# Patient Record
Sex: Male | Born: 1965
Health system: Southern US, Community
[De-identification: ages and names within clinical notes are randomized; demographics above are authoritative.]

## PROBLEM LIST (undated history)

## (undated) DIAGNOSIS — C6290 Malignant neoplasm of unspecified testis, unspecified whether descended or undescended: Secondary | ICD-10-CM

## (undated) DIAGNOSIS — Z86010 Personal history of colonic polyps: Secondary | ICD-10-CM

## (undated) DIAGNOSIS — K219 Gastro-esophageal reflux disease without esophagitis: Secondary | ICD-10-CM

## (undated) DIAGNOSIS — K5792 Diverticulitis of intestine, part unspecified, without perforation or abscess without bleeding: Secondary | ICD-10-CM

## (undated) DIAGNOSIS — T7840XA Allergy, unspecified, initial encounter: Secondary | ICD-10-CM

## (undated) DIAGNOSIS — C6931 Malignant neoplasm of right choroid: Secondary | ICD-10-CM

## (undated) DIAGNOSIS — E7800 Pure hypercholesterolemia, unspecified: Secondary | ICD-10-CM

## (undated) DIAGNOSIS — Z889 Allergy status to unspecified drugs, medicaments and biological substances status: Secondary | ICD-10-CM

## (undated) DIAGNOSIS — Z8719 Personal history of other diseases of the digestive system: Secondary | ICD-10-CM

## (undated) HISTORY — DX: Malignant neoplasm of right choroid: C69.31

## (undated) HISTORY — DX: Personal history of colonic polyps: Z86.010

## (undated) HISTORY — DX: Malignant neoplasm of unspecified testis, unspecified whether descended or undescended: C62.90

## (undated) HISTORY — PX: WISDOM TOOTH EXTRACTION: SHX21

## (undated) HISTORY — DX: Allergy, unspecified, initial encounter: T78.40XA

---

## 2004-01-12 ENCOUNTER — Ambulatory Visit (HOSPITAL_COMMUNITY): Admission: RE | Admit: 2004-01-12 | Discharge: 2004-01-12 | Payer: Self-pay | Admitting: Surgery

## 2004-01-12 ENCOUNTER — Ambulatory Visit (HOSPITAL_BASED_OUTPATIENT_CLINIC_OR_DEPARTMENT_OTHER): Admission: RE | Admit: 2004-01-12 | Discharge: 2004-01-12 | Payer: Self-pay | Admitting: Surgery

## 2012-04-23 ENCOUNTER — Ambulatory Visit: Payer: Self-pay | Admitting: Family Medicine

## 2012-04-23 ENCOUNTER — Encounter: Payer: Self-pay | Admitting: Family Medicine

## 2012-04-23 VITALS — BP 132/83 | HR 93 | Temp 98.1°F | Resp 16 | Ht 67.0 in | Wt 158.0 lb

## 2012-04-23 DIAGNOSIS — R319 Hematuria, unspecified: Secondary | ICD-10-CM

## 2012-04-23 DIAGNOSIS — Z0289 Encounter for other administrative examinations: Secondary | ICD-10-CM

## 2012-04-23 LAB — POCT URINALYSIS DIPSTICK
Glucose, UA: NEGATIVE
Protein, UA: NEGATIVE
Spec Grav, UA: 1.015

## 2012-04-27 DIAGNOSIS — Z0000 Encounter for general adult medical examination without abnormal findings: Secondary | ICD-10-CM | POA: Insufficient documentation

## 2012-04-27 NOTE — Progress Notes (Signed)
Subjective:    Patient ID: Ray Roberts, male    DOB: 1966/03/06, 47 y.o.   MRN: [id number]  HPI This 47 y.o. Cauc male is here for a DOT exam. He has not chronic medical problems  and maintains an active recreational life. He hiked >10 miles this past weekend w/ family and   friends; he reports moderately severe leg muscle soreness for a few days but is better now.  His primary care home is not clear; he has received care at a practice at [facility] and has  been seen at [facility]. He will schedule a CPE within the next 6 months.     Review of Systems  Constitutional: Negative.   HENT: Negative.   Eyes: Negative.   Respiratory: Negative.   Cardiovascular: Negative.   Gastrointestinal: Negative.   Genitourinary: Negative.   Musculoskeletal: Negative.   Skin: Negative.   Neurological: Negative.   Hematological: Negative.   Psychiatric/Behavioral: Negative.        Objective:   Physical Exam  Nursing note and vitals reviewed. Constitutional: He is oriented to person, place, and time. Vital signs are normal. He appears well-developed and well-nourished. No distress.  HENT:  Head: Normocephalic and atraumatic.  Right Ear: Hearing, tympanic membrane, external ear and ear canal normal.  Left Ear: Hearing, tympanic membrane, external ear and ear canal normal.  Nose: Nose normal. No mucosal edema, rhinorrhea, nasal deformity or septal deviation.  Mouth/Throat: Uvula is midline, oropharynx is clear and moist and mucous membranes are normal. No oral lesions. Normal dentition. No dental caries.  Eyes: Conjunctivae normal, EOM and lids are normal. Pupils are equal, round, and reactive to light. No scleral icterus.  Fundoscopic exam:      The right eye shows no arteriolar narrowing, no AV nicking, no hemorrhage and no papilledema. The right eye shows red reflex.      The left eye shows no arteriolar narrowing, no AV nicking, no hemorrhage and no papilledema. The left eye shows red  reflex. Neck: Normal range of motion. Neck supple. No thyromegaly present.  Cardiovascular: Normal rate, regular rhythm, normal heart sounds and intact distal pulses.  Exam reveals no gallop and no friction rub.   No murmur heard. Pulmonary/Chest: Effort normal and breath sounds normal. No respiratory distress. He exhibits no tenderness.  Abdominal: Soft. Normal appearance and normal aorta. He exhibits no distension, no abdominal bruit, no pulsatile midline mass and no mass. Bowel sounds are decreased. There is no hepatosplenomegaly. There is no tenderness. There is no guarding and no CVA tenderness. No hernia. Hernia confirmed negative in the right inguinal area and confirmed negative in the left inguinal area.  Genitourinary:       GU -except for hernia inspection- exam deferred.  Musculoskeletal: Normal range of motion. He exhibits no edema and no tenderness.  Lymphadenopathy:    He has no cervical adenopathy.  Neurological: He is alert and oriented to person, place, and time. He has normal reflexes. No cranial nerve deficit. He exhibits normal muscle tone. Coordination normal.  Skin: Skin is warm and dry. No rash noted. No erythema. No pallor.  Psychiatric: He has a normal mood and affect. His behavior is normal. Judgment and thought content normal.    Results for orders placed in visit on 04/23/12  POCT URINALYSIS DIPSTICK      Component Value Range   Color, UA yellow     Clarity, UA       Glucose, UA neg     Bilirubin,  UA       Ketones, UA       Spec Grav, UA 1.015     Blood, UA moderate     pH, UA       Protein, UA neg     Urobilinogen, UA       Nitrite, UA       Leukocytes, UA             Assessment & Plan:   1. Health examination of defined subpopulation - DOT physical POCT urinalysis dipstick Pt certified for 1 year.  2. Hematuria - painless microscopic Pt advised to have this addressed by PCP at CPE within next 3-6 months or sooner if he becomes symptomatic or has  gross hematuria.

## 2012-05-05 ENCOUNTER — Encounter: Payer: Self-pay | Admitting: Family Medicine

## 2014-04-01 DIAGNOSIS — C6931 Malignant neoplasm of right choroid: Secondary | ICD-10-CM

## 2014-04-01 HISTORY — PX: HIGH DOSE RADIATION IMPLANT INSERTION: SHX6258

## 2014-04-01 HISTORY — DX: Malignant neoplasm of right choroid: C69.31

## 2014-06-13 ENCOUNTER — Other Ambulatory Visit: Payer: Self-pay | Admitting: Internal Medicine

## 2014-06-13 DIAGNOSIS — C439 Malignant melanoma of skin, unspecified: Secondary | ICD-10-CM

## 2014-06-14 ENCOUNTER — Ambulatory Visit
Admission: RE | Admit: 2014-06-14 | Discharge: 2014-06-14 | Disposition: A | Discharge status: Home / Self Care | Payer: BLUE CROSS/BLUE SHIELD | Source: Ambulatory Visit | Attending: Internal Medicine | Admitting: Internal Medicine

## 2014-06-14 DIAGNOSIS — C439 Malignant melanoma of skin, unspecified: Secondary | ICD-10-CM

## 2014-06-14 MED ORDER — IOPAMIDOL (ISOVUE-300) INJECTION 61%
100.0000 mL | Freq: Once | INTRAVENOUS | Status: AC | PRN
  Administered 2014-06-14: 100 mL via INTRAVENOUS

## 2015-06-01 DIAGNOSIS — Z139 Encounter for screening, unspecified: Secondary | ICD-10-CM

## 2015-07-10 NOTE — Congregational Nurse Program (Signed)
Congregational Nurse Program Note  Date of Encounter: 06/01/2015  Past Medical History: No past medical history on file.  Encounter Details:     CNP Questionnaire - 07/10/15 1341    Patient Demographics   Is this a new or existing patient? New   Patient is considered a/an Not Applicable   Race Caucasian/White   Patient Assistance   Location of Patient Assistance Not Applicable   Patient's financial/insurance status Private Insurance Coverage   Uninsured Patient No   Patient referred to apply for the following financial assistance Not Applicable   Food insecurities addressed Not Applicable   Transportation assistance No   Assistance securing medications No   Educational health offerings Not Applicable   Encounter Details   Primary purpose of visit Other   Was an Emergency Department visit averted? Not Applicable   Does patient have a medical provider? Yes   Patient referred to Not Applicable   Was a mental health screening completed? (GAINS tool) No   Does patient have dental issues? No   Does patient have vision issues? No   Since previous encounter, have you referred patient for abnormal blood pressure that resulted in a new diagnosis or medication change? No   Since previous encounter, have you referred patient for abnormal blood glucose that resulted in a new diagnosis or medication change? No     06-01-15 Patient requested BP screening per his doctor request.  Patient verbalized he was glad that his BP today was normal.  Plan to follow up if needed or requested.  Hali Marry RN, CNP # [phone].

## 2016-06-10 DIAGNOSIS — Z0000 Encounter for general adult medical examination without abnormal findings: Secondary | ICD-10-CM | POA: Diagnosis not present

## 2016-06-17 DIAGNOSIS — R7301 Impaired fasting glucose: Secondary | ICD-10-CM | POA: Diagnosis not present

## 2016-06-17 DIAGNOSIS — C439 Malignant melanoma of skin, unspecified: Secondary | ICD-10-CM | POA: Diagnosis not present

## 2016-06-17 DIAGNOSIS — Z23 Encounter for immunization: Secondary | ICD-10-CM | POA: Diagnosis not present

## 2016-06-17 DIAGNOSIS — Z0000 Encounter for general adult medical examination without abnormal findings: Secondary | ICD-10-CM | POA: Diagnosis not present

## 2016-06-17 DIAGNOSIS — E784 Other hyperlipidemia: Secondary | ICD-10-CM | POA: Diagnosis not present

## 2016-06-17 DIAGNOSIS — Z1389 Encounter for screening for other disorder: Secondary | ICD-10-CM | POA: Diagnosis not present

## 2016-06-24 DIAGNOSIS — K4090 Unilateral inguinal hernia, without obstruction or gangrene, not specified as recurrent: Secondary | ICD-10-CM | POA: Diagnosis not present

## 2016-06-24 DIAGNOSIS — N448 Other noninflammatory disorders of the testis: Secondary | ICD-10-CM | POA: Diagnosis not present

## 2016-06-25 DIAGNOSIS — D4959 Neoplasm of unspecified behavior of other genitourinary organ: Secondary | ICD-10-CM | POA: Diagnosis not present

## 2016-07-03 ENCOUNTER — Ambulatory Visit: Payer: Self-pay | Admitting: General Surgery

## 2016-07-03 DIAGNOSIS — K429 Umbilical hernia without obstruction or gangrene: Secondary | ICD-10-CM | POA: Diagnosis not present

## 2016-07-03 DIAGNOSIS — K4090 Unilateral inguinal hernia, without obstruction or gangrene, not specified as recurrent: Secondary | ICD-10-CM | POA: Diagnosis not present

## 2016-07-03 NOTE — H&P (Signed)
Ray Roberts 07/03/2016 11:20 AM Location: [facility] Patient #: [id number] DOB: [birth year] Married / Language: English / Race: Refused to Report/Unreported Male  History of Present Illness Ray Hollingshead MD; 07/03/2016 12:14 PM) Patient words: np.  The patient is a 51 year old male.   Note:He is referred by Dr. Diona Roberts for consultation regarding a right inguinal hernia. He is known about the hernia for about 2 years. However, for about a year, he noted a enlargement of the right testicle. He saw his primary care physician Dr. Joylene Roberts who referred him to Dr. Diona Roberts. A right testicular mass was noted and was suspicious for malignancy based on ultrasound and clinical criteria. Radical orchiectomy was recommended. He is also noted to have a right inguinal hernia at that time. He states the hernia has been asymptomatic. He also reports an asymptomatic umbilical hernia.  Past Surgical History Ray Roberts, RMA; 07/03/2016 11:21 AM) No pertinent past surgical history  Allergies Shirlean Mylar Roberts, RMA; 07/03/2016 11:23 AM) Sulfacetamide *CHEMICALS*  Medication History Shirlean Mylar Roberts, RMA; 07/03/2016 11:23 AM) Atorvastatin Calcium (20MG  Tablet, Oral) Active. ZyrTEC Allergy (10MG  Tablet, Oral) Active. Medications Reconciled  Social History Ray Roberts, RMA; 07/03/2016 11:23 AM) Alcohol use Moderate alcohol use. Caffeine use Coffee. No drug use  Family History Ray Roberts, RMA; 07/03/2016 11:23 AM) Arthritis Mother. Cancer Father. Cerebrovascular Accident Daughter. Diabetes Mellitus Mother. Heart Disease Father. Heart disease in male family member before age 26 Hypertension Mother. Melanoma Father, Mother.  Other Problems Ray Roberts, RMA; 07/03/2016 11:23 AM) Cancer Diverticulosis Gastroesophageal Reflux Disease Hypercholesterolemia     Review of Systems (Ray Roberts RMA; 07/03/2016 11:23 AM) General Not Present- Appetite Loss, Chills, Fatigue,  Fever, Night Sweats, Weight Gain and Weight Loss. Skin Not Present- Change in Wart/Mole, Dryness, Hives, Jaundice, New Lesions, Non-Healing Wounds, Rash and Ulcer. HEENT Not Present- Earache, Hearing Loss, Hoarseness, Nose Bleed, Oral Ulcers, Ringing in the Ears, Seasonal Allergies, Sinus Pain, Sore Throat, Visual Disturbances, Wears glasses/contact lenses and Yellow Eyes. Respiratory Not Present- Bloody sputum, Chronic Cough, Difficulty Breathing, Snoring and Wheezing. Breast Not Present- Breast Mass, Breast Pain, Nipple Discharge and Skin Changes. Cardiovascular Not Present- Chest Pain, Difficulty Breathing Lying Down, Leg Cramps, Palpitations, Rapid Heart Rate, Shortness of Breath and Swelling of Extremities. Gastrointestinal Not Present- Abdominal Pain, Bloating, Bloody Stool, Change in Bowel Habits, Chronic diarrhea, Constipation, Difficulty Swallowing, Excessive gas, Gets full quickly at meals, Hemorrhoids, Indigestion, Nausea, Rectal Pain and Vomiting. Male Genitourinary Not Present- Blood in Urine, Change in Urinary Stream, Frequency, Impotence, Nocturia, Painful Urination, Urgency and Urine Leakage. Musculoskeletal Not Present- Back Pain, Joint Pain, Joint Stiffness, Muscle Pain, Muscle Weakness and Swelling of Extremities. Neurological Not Present- Decreased Memory, Fainting, Headaches, Numbness, Seizures, Tingling, Tremor, Trouble walking and Weakness. Psychiatric Not Present- Anxiety, Bipolar, Change in Sleep Pattern, Depression, Fearful and Frequent crying. Endocrine Not Present- Cold Intolerance, Excessive Hunger, Hair Changes, Heat Intolerance, Hot flashes and New Diabetes. Hematology Not Present- Blood Thinners, Easy Bruising, Excessive bleeding, Gland problems, HIV and Persistent Infections.  Vitals (Ray Roberts RMA; 07/03/2016 11:24 AM) 07/03/2016 11:23 AM Weight: 164 lb Height: 67in Body Surface Area: 1.86 m Body Mass Index: 25.69 kg/m  Pulse: 91 (Regular)  BP: 142/90  (Sitting, Left Arm, Standard)      Physical Exam Ray Hollingshead MD; 07/03/2016 12:16 PM)  The physical exam findings are as follows: Note:GENERAL APPEARANCE: WDWN in NAD. Pleasant and cooperative.  EARS, NOSE, MOUTH THROAT: Nenzel/AT external ears: no lesions or deformities external nose: no lesions  or deformities hearing: grossly normal lips: moist, no deformities EYES external: conjunctiva, lids, sclerae normal pupils: equal, round glasses: no  NECK: Supple, no obvious mass or thyroid mass/enlargement, no trachea deviation  CV ascultation: RRR, no murmur extremity edema: no   RESP auscultation: breath sounds equal and clear respiratory effort: normal   GASTROINTESTINAL abdomen: Soft, non-tender, non-distended, no masses liver and spleen: not enlarged. hernia: Right inguinal bulge consistent with hernia; no left inguinal bulge; reducible umbilical hernia scar: none present   GENITOURINARY scrotum: Hard right testicular mass; enlarged lymph node/soft tissue area right groin penis: no lesions  MUSCULOSKELETAL station and gait: normal digits/nails: no clubbing or cyanosis muscle strength: grossly normal all extremities deformities: none instability: none  NEUROLOGIC sensation: intact to touch   PSYCHIATRIC alertness and orientation: normal mood/affect/behavior: normal judgement and insight: normal    Assessment & Plan Ray Hollingshead MD; 07/03/2016 12:17 PM)  INGUINAL HERNIA OF RIGHT SIDE WITHOUT OBSTRUCTION OR GANGRENE (K40.90) Impression: This has not been symptomatic. However, he is going to have a radical right orchiectomy and the hernia should be repaired same time in my opinion. He also has what appears to be an enlarged right inguinal lymph node that may need to be removed and we discussed this..  Plan: Open right inguinal hernia with mesh in coordination with radical right orchiectomy. I have explained the procedure, risks, and aftercare of  inguinal hernia repair. Risks include but are not limited to bleeding, infection, wound problems, anesthesia, recurrence, bladder or intestine injury, urinary retention, testicular dysfunction, chronic pain, mesh problems. He seems to understand and agrees with the plan. My office will coordinate this with Dr. Alan Ripper office.  Jackolyn Confer, M.D.

## 2016-07-04 ENCOUNTER — Other Ambulatory Visit: Payer: Self-pay | Admitting: Urology

## 2016-07-08 ENCOUNTER — Encounter (HOSPITAL_COMMUNITY): Payer: Self-pay

## 2016-07-08 NOTE — Patient Instructions (Signed)
Ray Roberts  07/08/2016   Your procedure is scheduled on: 07-11-16  Report to [facility] Main  Entrance Take Cane Beds  elevators to 3rd floor to  [place] [facility] [place].   Call this number if you have problems the morning of surgery [phone]    Remember: ONLY 1 PERSON MAY GO WITH YOU TO SHORT STAY TO GET  READY MORNING OF YOUR SURGERY.  Do not eat food or drink liquids :After Midnight.     Take these medicines the morning of surgery with A SIP OF WATER:  Famotidine(pepcid), nasal spray as needed                                You may not have any metal on your body including hair pins and              piercings  Do not wear jewelry, make-up, lotions, powders or perfumes, deodorant              Men may shave face and neck.   Do not bring valuables to the hospital. [place].  Contacts, dentures or bridgework may not be worn into surgery.      Patients discharged the day of surgery will not be allowed to drive home.  Name and phone number of your driver:  Special Instructions: N/A              Please read over the following fact sheets you were given: _____________________________________________________________________             [facility] - Preparing for Surgery Before surgery, you can play an important role.  Because skin is not sterile, your skin needs to be as free of germs as possible.  You can reduce the number of germs on your skin by washing with CHG (chlorahexidine gluconate) soap before surgery.  CHG is an antiseptic cleaner which kills germs and bonds with the skin to continue killing germs even after washing. Please DO NOT use if you have an allergy to CHG or antibacterial soaps.  If your skin becomes reddened/irritated stop using the CHG and inform your nurse when you arrive at Short Stay. Do not shave (including legs and underarms) for at least 48 hours prior to the first CHG shower.   You may shave your face/neck. Please follow these instructions carefully:  1.  Shower with CHG Soap the night before surgery and the  morning of Surgery.  2.  If you choose to wash your hair, wash your hair first as usual with your  normal  shampoo.  3.  After you shampoo, rinse your hair and body thoroughly to remove the  shampoo.                           4.  Use CHG as you would any other liquid soap.  You can apply chg directly  to the skin and wash                       Gently with a scrungie or clean washcloth.  5.  Apply the CHG Soap to your body ONLY FROM THE NECK DOWN.  Do not use on face/ open                           Wound or open sores. Avoid contact with eyes, ears mouth and genitals (private parts).                       Wash face,  Genitals (private parts) with your normal soap.             6.  Wash thoroughly, paying special attention to the area where your surgery  will be performed.  7.  Thoroughly rinse your body with warm water from the neck down.  8.  DO NOT shower/wash with your normal soap after using and rinsing off  the CHG Soap.                9.  Pat yourself dry with a clean towel.            10.  Wear clean pajamas.            11.  Place clean sheets on your bed the night of your first shower and do not  sleep with pets. Day of Surgery : Do not apply any lotions/deodorants the morning of surgery.  Please wear clean clothes to the hospital/surgery center.  FAILURE TO FOLLOW THESE INSTRUCTIONS MAY RESULT IN THE CANCELLATION OF YOUR SURGERY PATIENT SIGNATURE_________________________________  NURSE SIGNATURE__________________________________  ________________________________________________________________________

## 2016-07-09 ENCOUNTER — Encounter (HOSPITAL_COMMUNITY): Payer: Self-pay

## 2016-07-09 ENCOUNTER — Encounter (HOSPITAL_COMMUNITY)
Admission: RE | Admit: 2016-07-09 | Discharge: 2016-07-09 | Disposition: A | Discharge status: Home / Self Care | Payer: 59 | Source: Ambulatory Visit | Attending: General Surgery | Admitting: General Surgery

## 2016-07-09 DIAGNOSIS — J302 Other seasonal allergic rhinitis: Secondary | ICD-10-CM | POA: Diagnosis not present

## 2016-07-09 DIAGNOSIS — K4090 Unilateral inguinal hernia, without obstruction or gangrene, not specified as recurrent: Secondary | ICD-10-CM | POA: Diagnosis not present

## 2016-07-09 DIAGNOSIS — Z79899 Other long term (current) drug therapy: Secondary | ICD-10-CM | POA: Diagnosis not present

## 2016-07-09 DIAGNOSIS — E7800 Pure hypercholesterolemia, unspecified: Secondary | ICD-10-CM | POA: Diagnosis not present

## 2016-07-09 DIAGNOSIS — E785 Hyperlipidemia, unspecified: Secondary | ICD-10-CM | POA: Diagnosis not present

## 2016-07-09 DIAGNOSIS — K219 Gastro-esophageal reflux disease without esophagitis: Secondary | ICD-10-CM | POA: Diagnosis not present

## 2016-07-09 DIAGNOSIS — Z882 Allergy status to sulfonamides status: Secondary | ICD-10-CM | POA: Diagnosis not present

## 2016-07-09 DIAGNOSIS — C6211 Malignant neoplasm of descended right testis: Secondary | ICD-10-CM | POA: Diagnosis not present

## 2016-07-09 DIAGNOSIS — N5089 Other specified disorders of the male genital organs: Secondary | ICD-10-CM | POA: Diagnosis present

## 2016-07-09 HISTORY — DX: Pure hypercholesterolemia, unspecified: E78.00

## 2016-07-09 HISTORY — DX: Gastro-esophageal reflux disease without esophagitis: K21.9

## 2016-07-09 HISTORY — DX: Allergy status to unspecified drugs, medicaments and biological substances: Z88.9

## 2016-07-09 HISTORY — DX: Personal history of other diseases of the digestive system: Z87.19

## 2016-07-09 LAB — CBC
HCT: 42.4 % (ref 39.0–52.0)
HEMOGLOBIN: 14.4 g/dL (ref 13.0–17.0)
MCH: 29.8 pg (ref 26.0–34.0)
MCHC: 34 g/dL (ref 30.0–36.0)
MCV: 87.6 fL (ref 78.0–100.0)
PLATELETS: 395 10*3/uL (ref 150–400)
RBC: 4.84 MIL/uL (ref 4.22–5.81)
RDW: 13.3 % (ref 11.5–15.5)
WBC: 7.7 10*3/uL (ref 4.0–10.5)

## 2016-07-09 LAB — BASIC METABOLIC PANEL
Anion gap: 6 (ref 5–15)
BUN: 12 mg/dL (ref 6–20)
CALCIUM: 9.3 mg/dL (ref 8.9–10.3)
CHLORIDE: 103 mmol/L (ref 101–111)
CO2: 27 mmol/L (ref 22–32)
CREATININE: 0.9 mg/dL (ref 0.61–1.24)
Glucose, Bld: 103 mg/dL — ABNORMAL HIGH (ref 65–99)
Potassium: 4.5 mmol/L (ref 3.5–5.1)
SODIUM: 136 mmol/L (ref 135–145)

## 2016-07-10 NOTE — H&P (Signed)
Urology History and Physical Exam  CC: Testicular mass  HPI: 51 year old male presents for combined rt inguinal orchiectomy as well as a right inguinal hermia repair. He recently presented with a right testicular mass. Evaluation included a scrotal U/S revealing findings c/w a right testicular tumor. pE corresponded to U/S. His serum tumor markers were nml. He also has a right inguinal hernia and saw Dr Jackolyn Confer. This will be repaired in conjunction with his right inguinal orchiectomy.  PMH: Past Medical History:  Diagnosis Date  . Elevated cholesterol   . GERD (gastroesophageal reflux disease)   . History of hiatal hernia   . Hx of seasonal allergies   . Testicular mass    right    PSH: Past Surgical History:  Procedure Laterality Date  . radiation seed pack  2016   for mole on back of right eye    Allergies: Allergies  Allergen Reactions  . Sulfa Antibiotics Hives    Medications: No prescriptions prior to admission.     Social History: Social History   Social History  . Marital status: Married    Spouse name: N/A  . Number of children: N/A  . Years of education: N/A   Occupational History  . Not on file.   Social History Main Topics  . Smoking status: Never Smoker  . Smokeless tobacco: Never Used  . Alcohol use Yes     Comment:  15 cans beer/week  . Drug use: No  . Sexual activity: Not on file   Other Topics Concern  . Not on file   Social History Narrative  . No narrative on file    Family History: No family history on file.  Review of Systems: Positive: Rt testicular swelling Negative:   A further 10 point review of systems was negative except what is listed in the HPI.                  Physical Exam: @VITALS2 @ General: No acute distress.  Awake. Head:  Normocephalic.  Atraumatic. ENT:  EOMI.  Mucous membranes moist Neck:  Supple.  No lymphadenopathy. CV:  S1 present. S2 present. Regular rate. Pulmonary: Equal effort bilaterally.   Clear to auscultation bilaterally. Abdomen: Soft.  Non tender to palpation. Skin:  Normal turgor.  No visible rash. Extremity: No gross deformity of bilateral upper extremities.  No gross deformity of                             lower extremities. Neurologic: Alert. Appropriate mood.  Penis:  Circumcised.  No lesions. Urethra: Orthotopic meatus. Scrotum: No lesions.  No ecchymosis.  No erythema. Testicles: Descended bilaterally.  Rt testicular mass. Epididymis: Palpable bilaterally. Nontender to palpation.  Studies:  Recent Labs     07/09/16  0850  HGB  14.4  WBC  7.7  PLT  395    Recent Labs     07/09/16  0850  NA  136  K  4.5  CL  103  CO2  27  BUN  12  CREATININE  0.90  CALCIUM  9.3  GFRNONAA  >60  GFRAA  >60     No results for input(s): INR, APTT in the last 72 hours.  Invalid input(s): PT   Invalid input(s): ABG    Assessment:  Right testicular mass, right inguinal hernia  Plan: Right inguinal orchiectomy

## 2016-07-11 ENCOUNTER — Ambulatory Visit (HOSPITAL_COMMUNITY): Payer: 59 | Admitting: Anesthesiology

## 2016-07-11 ENCOUNTER — Encounter (HOSPITAL_COMMUNITY): Payer: Self-pay | Admitting: *Deleted

## 2016-07-11 ENCOUNTER — Encounter (HOSPITAL_COMMUNITY)
Admission: RE | Disposition: A | Discharge status: Home / Self Care | Payer: Self-pay | Source: Ambulatory Visit | Attending: General Surgery

## 2016-07-11 ENCOUNTER — Ambulatory Visit (HOSPITAL_COMMUNITY)
Admission: RE | Admit: 2016-07-11 | Discharge: 2016-07-11 | Disposition: A | Discharge status: Home / Self Care | Payer: 59 | Source: Ambulatory Visit | Attending: General Surgery | Admitting: General Surgery

## 2016-07-11 DIAGNOSIS — C6211 Malignant neoplasm of descended right testis: Secondary | ICD-10-CM | POA: Insufficient documentation

## 2016-07-11 DIAGNOSIS — E7800 Pure hypercholesterolemia, unspecified: Secondary | ICD-10-CM | POA: Insufficient documentation

## 2016-07-11 DIAGNOSIS — K4090 Unilateral inguinal hernia, without obstruction or gangrene, not specified as recurrent: Secondary | ICD-10-CM | POA: Diagnosis not present

## 2016-07-11 DIAGNOSIS — Z882 Allergy status to sulfonamides status: Secondary | ICD-10-CM | POA: Insufficient documentation

## 2016-07-11 DIAGNOSIS — K219 Gastro-esophageal reflux disease without esophagitis: Secondary | ICD-10-CM | POA: Insufficient documentation

## 2016-07-11 DIAGNOSIS — Z79899 Other long term (current) drug therapy: Secondary | ICD-10-CM | POA: Insufficient documentation

## 2016-07-11 DIAGNOSIS — G8918 Other acute postprocedural pain: Secondary | ICD-10-CM | POA: Diagnosis not present

## 2016-07-11 DIAGNOSIS — C6291 Malignant neoplasm of right testis, unspecified whether descended or undescended: Secondary | ICD-10-CM | POA: Diagnosis not present

## 2016-07-11 DIAGNOSIS — J302 Other seasonal allergic rhinitis: Secondary | ICD-10-CM | POA: Insufficient documentation

## 2016-07-11 DIAGNOSIS — E785 Hyperlipidemia, unspecified: Secondary | ICD-10-CM | POA: Insufficient documentation

## 2016-07-11 HISTORY — PX: ORCHIECTOMY: SHX2116

## 2016-07-11 HISTORY — PX: INSERTION OF MESH: SHX5868

## 2016-07-11 HISTORY — PX: INGUINAL HERNIA REPAIR: SHX194

## 2016-07-11 SURGERY — REPAIR, HERNIA, INGUINAL, ADULT
Anesthesia: Regional | Site: Scrotum | Laterality: Right

## 2016-07-11 MED ORDER — LACTATED RINGERS IV SOLN
INTRAVENOUS | Status: DC
  Administered 2016-07-11: 08:00:00 via INTRAVENOUS

## 2016-07-11 MED ORDER — ONDANSETRON HCL 4 MG/2ML IJ SOLN
INTRAMUSCULAR | Status: AC
  Filled 2016-07-11: qty 2

## 2016-07-11 MED ORDER — 0.9 % SODIUM CHLORIDE (POUR BTL) OPTIME
TOPICAL | Status: DC | PRN
  Administered 2016-07-11: 1000 mL

## 2016-07-11 MED ORDER — POLYMYXIN B SULFATE 500000 UNITS IJ SOLR
INTRAMUSCULAR | Status: AC
  Filled 2016-07-11: qty 500000

## 2016-07-11 MED ORDER — SUGAMMADEX SODIUM 200 MG/2ML IV SOLN
INTRAVENOUS | Status: DC | PRN
Start: 2016-07-11 — End: 2016-07-11
  Administered 2016-07-11: 150 mg via INTRAVENOUS

## 2016-07-11 MED ORDER — DEXAMETHASONE SODIUM PHOSPHATE 10 MG/ML IJ SOLN
INTRAMUSCULAR | Status: DC | PRN
  Administered 2016-07-11: 10 mg via INTRAVENOUS

## 2016-07-11 MED ORDER — HYDROCODONE-ACETAMINOPHEN 7.5-325 MG PO TABS
ORAL_TABLET | ORAL | Status: AC
  Filled 2016-07-11: qty 1

## 2016-07-11 MED ORDER — HYDROCODONE-ACETAMINOPHEN 7.5-325 MG PO TABS
1.0000 | ORAL_TABLET | Freq: Once | ORAL | Status: AC | PRN
  Administered 2016-07-11: 1 via ORAL

## 2016-07-11 MED ORDER — BUPIVACAINE-EPINEPHRINE (PF) 0.5% -1:200000 IJ SOLN
INTRAMUSCULAR | Status: AC
  Filled 2016-07-11: qty 30

## 2016-07-11 MED ORDER — OXYCODONE HCL 5 MG PO TABS: 5.0000 mg | ORAL_TABLET | ORAL | 0 refills | Status: DC | PRN

## 2016-07-11 MED ORDER — SODIUM CHLORIDE 0.9 % IR SOLN
Status: DC | PRN
  Administered 2016-07-11: 500 mL

## 2016-07-11 MED ORDER — BUPIVACAINE-EPINEPHRINE (PF) 0.5% -1:200000 IJ SOLN
INTRAMUSCULAR | Status: DC | PRN
  Administered 2016-07-11: 30 mL via PERINEURAL

## 2016-07-11 MED ORDER — CHLORHEXIDINE GLUCONATE CLOTH 2 % EX PADS: 6.0000 | MEDICATED_PAD | Freq: Once | CUTANEOUS | Status: DC

## 2016-07-11 MED ORDER — PROMETHAZINE HCL 25 MG/ML IJ SOLN: 6.2500 mg | INTRAMUSCULAR | Status: DC | PRN

## 2016-07-11 MED ORDER — FENTANYL CITRATE (PF) 100 MCG/2ML IJ SOLN
50.0000 ug | INTRAMUSCULAR | Status: AC | PRN
  Administered 2016-07-11: 50 ug via INTRAVENOUS
  Administered 2016-07-11 (×2): 100 ug via INTRAVENOUS

## 2016-07-11 MED ORDER — FENTANYL CITRATE (PF) 250 MCG/5ML IJ SOLN
INTRAMUSCULAR | Status: AC
  Filled 2016-07-11: qty 5

## 2016-07-11 MED ORDER — MIDAZOLAM HCL 2 MG/2ML IJ SOLN
1.0000 mg | INTRAMUSCULAR | Status: DC | PRN
  Administered 2016-07-11: 2 mg via INTRAVENOUS

## 2016-07-11 MED ORDER — MEPERIDINE HCL 50 MG/ML IJ SOLN: 6.2500 mg | INTRAMUSCULAR | Status: DC | PRN

## 2016-07-11 MED ORDER — MIDAZOLAM HCL 2 MG/2ML IJ SOLN
INTRAMUSCULAR | Status: AC
  Filled 2016-07-11: qty 2

## 2016-07-11 MED ORDER — PROPOFOL 10 MG/ML IV BOLUS
INTRAVENOUS | Status: AC
  Filled 2016-07-11: qty 20

## 2016-07-11 MED ORDER — CEFAZOLIN SODIUM-DEXTROSE 2-4 GM/100ML-% IV SOLN
2.0000 g | INTRAVENOUS | Status: AC
  Administered 2016-07-11: 2 g via INTRAVENOUS
  Filled 2016-07-11: qty 100

## 2016-07-11 MED ORDER — ROCURONIUM BROMIDE 100 MG/10ML IV SOLN
INTRAVENOUS | Status: DC | PRN
  Administered 2016-07-11: 50 mg via INTRAVENOUS

## 2016-07-11 MED ORDER — ROCURONIUM BROMIDE 50 MG/5ML IV SOSY
PREFILLED_SYRINGE | INTRAVENOUS | Status: AC
  Filled 2016-07-11: qty 5

## 2016-07-11 MED ORDER — DEXAMETHASONE SODIUM PHOSPHATE 10 MG/ML IJ SOLN
INTRAMUSCULAR | Status: AC
  Filled 2016-07-11: qty 1

## 2016-07-11 MED ORDER — LIDOCAINE HCL (CARDIAC) 20 MG/ML IV SOLN
INTRAVENOUS | Status: DC | PRN
  Administered 2016-07-11: 50 mg via INTRAVENOUS

## 2016-07-11 MED ORDER — HYDROMORPHONE HCL 2 MG/ML IJ SOLN
0.3000 mg | INTRAMUSCULAR | Status: DC | PRN
  Administered 2016-07-11: 0.3 mg via INTRAVENOUS

## 2016-07-11 MED ORDER — BUPIVACAINE-EPINEPHRINE 0.5% -1:200000 IJ SOLN
INTRAMUSCULAR | Status: DC | PRN
  Administered 2016-07-11: 13 mL

## 2016-07-11 MED ORDER — LIDOCAINE 2% (20 MG/ML) 5 ML SYRINGE
INTRAMUSCULAR | Status: AC
  Filled 2016-07-11: qty 5

## 2016-07-11 MED ORDER — SCOPOLAMINE 1 MG/3DAYS TD PT72: 1.0000 | MEDICATED_PATCH | TRANSDERMAL | Status: DC

## 2016-07-11 MED ORDER — PROPOFOL 10 MG/ML IV BOLUS
INTRAVENOUS | Status: DC | PRN
  Administered 2016-07-11: 150 mg via INTRAVENOUS
  Administered 2016-07-11: 50 mg via INTRAVENOUS

## 2016-07-11 MED ORDER — HYDROMORPHONE HCL 2 MG/ML IJ SOLN
INTRAMUSCULAR | Status: AC
  Filled 2016-07-11: qty 1

## 2016-07-11 MED ORDER — ONDANSETRON HCL 4 MG/2ML IJ SOLN
INTRAMUSCULAR | Status: DC | PRN
  Administered 2016-07-11: 4 mg via INTRAVENOUS

## 2016-07-11 MED ORDER — SUGAMMADEX SODIUM 200 MG/2ML IV SOLN
INTRAVENOUS | Status: AC
  Filled 2016-07-11: qty 2

## 2016-07-11 MED ORDER — CEFAZOLIN SODIUM-DEXTROSE 2-4 GM/100ML-% IV SOLN: 2.0000 g | INTRAVENOUS | Status: DC

## 2016-07-11 MED FILL — oxyCODONE HCL 5 MG TABS: 5 | 7 days supply | Qty: 40 | Fill #0

## 2016-07-11 SURGICAL SUPPLY — 51 items
APL SKNCLS STERI-STRIP NONHPOA (GAUZE/BANDAGES/DRESSINGS) ×2
BENZOIN TINCTURE PRP APPL 2/3 (GAUZE/BANDAGES/DRESSINGS) ×3 IMPLANT
BLADE HEX COATED 2.75 (ELECTRODE) ×6 IMPLANT
BLADE SURG 15 STRL LF DISP TIS (BLADE) ×2 IMPLANT
BLADE SURG 15 STRL SS (BLADE) ×3
CHLORAPREP W/TINT 26ML (MISCELLANEOUS) ×3 IMPLANT
COVER SURGICAL LIGHT HANDLE (MISCELLANEOUS) ×3 IMPLANT
DECANTER SPIKE VIAL GLASS SM (MISCELLANEOUS) ×3 IMPLANT
DRAIN PENROSE 18X1/2 LTX STRL (DRAIN) ×1 IMPLANT
DRAIN PENROSE 18X1/4 LTX STRL (WOUND CARE) ×3 IMPLANT
DRAPE INCISE IOBAN 66X45 STRL (DRAPES) ×3 IMPLANT
DRAPE LAPAROTOMY T 98X78 PEDS (DRAPES) ×3 IMPLANT
DRAPE LAPAROTOMY TRNSV 102X78 (DRAPE) ×3 IMPLANT
DRAPE UTILITY XL STRL (DRAPES) ×3 IMPLANT
DRSG KUZMA FLUFF (GAUZE/BANDAGES/DRESSINGS) ×1 IMPLANT
DRSG TEGADERM 4X4.75 (GAUZE/BANDAGES/DRESSINGS) ×1 IMPLANT
ELECT PENCIL ROCKER SW 15FT (MISCELLANEOUS) ×3 IMPLANT
ELECT REM PT RETURN 15FT ADLT (MISCELLANEOUS) ×6 IMPLANT
GAUZE SPONGE 4X4 12PLY STRL (GAUZE/BANDAGES/DRESSINGS) ×1 IMPLANT
GLOVE BIO SURGEON STRL SZ 6.5 (GLOVE) ×1 IMPLANT
GLOVE BIOGEL M 8.0 STRL (GLOVE) ×3 IMPLANT
GLOVE BIOGEL PI IND STRL 6.5 (GLOVE) IMPLANT
GLOVE BIOGEL PI IND STRL 7.5 (GLOVE) IMPLANT
GLOVE BIOGEL PI INDICATOR 6.5 (GLOVE) ×1
GLOVE BIOGEL PI INDICATOR 7.5 (GLOVE) ×2
GLOVE ECLIPSE 8.0 STRL XLNG CF (GLOVE) ×4 IMPLANT
GLOVE INDICATOR 8.0 STRL GRN (GLOVE) ×6 IMPLANT
GOWN STRL REUS W/TWL LRG LVL3 (GOWN DISPOSABLE) ×3 IMPLANT
GOWN STRL REUS W/TWL XL LVL3 (GOWN DISPOSABLE) ×8 IMPLANT
KIT BASIN OR (CUSTOM PROCEDURE TRAY) ×6 IMPLANT
MESH HERNIA 3X6 (Mesh General) ×1 IMPLANT
NDL HYPO 25X1 1.5 SAFETY (NEEDLE) ×2 IMPLANT
NEEDLE HYPO 25X1 1.5 SAFETY (NEEDLE) ×3 IMPLANT
PACK BASIC VI WITH GOWN DISP (CUSTOM PROCEDURE TRAY) ×3 IMPLANT
PACK GENERAL/GYN (CUSTOM PROCEDURE TRAY) ×3 IMPLANT
SPONGE LAP 4X18 X RAY DECT (DISPOSABLE) ×3 IMPLANT
STRIP CLOSURE SKIN 1/2X4 (GAUZE/BANDAGES/DRESSINGS) ×3 IMPLANT
SUPPORT SCROTAL MED ADLT STRP (MISCELLANEOUS) ×1 IMPLANT
SUT CHROMIC 3 0 SH 27 (SUTURE) ×3 IMPLANT
SUT MNCRL AB 4-0 PS2 18 (SUTURE) ×6 IMPLANT
SUT PROLENE 2 0 CT2 30 (SUTURE) ×6 IMPLANT
SUT VIC AB 2-0 SH 18 (SUTURE) ×6 IMPLANT
SUT VIC AB 2-0 UR5 27 (SUTURE) IMPLANT
SUT VIC AB 3-0 SH 27 (SUTURE) ×9
SUT VIC AB 3-0 SH 27XBRD (SUTURE) ×4 IMPLANT
SUT VICRYL 0 TIES 12 18 (SUTURE) ×3 IMPLANT
SYR BULB IRRIGATION 50ML (SYRINGE) ×3 IMPLANT
SYR CONTROL 10ML LL (SYRINGE) ×3 IMPLANT
TOWEL OR 17X26 10 PK STRL BLUE (TOWEL DISPOSABLE) ×6 IMPLANT
TOWEL OR NON WOVEN STRL DISP B (DISPOSABLE) ×3 IMPLANT
YANKAUER SUCT BULB TIP 10FT TU (MISCELLANEOUS) ×3 IMPLANT

## 2016-07-11 NOTE — Op Note (Signed)
Preoperative diagnosis: Right testicular mass, probable testicular cancer.  Postoperative diagnosis: Same  Principal procedure: Right inguinal orchiectomy  Surgeon: Diona Fanti  First assistant: Zella Richer  Anesthesia: Gen. with LMA  Complications: None  Estimated blood loss: Less than 5 mL  Specimen: Right testicle and cord  Indications: 51 year old male with recent presentation with a right testicular mass.  This has been present for some time.  He was having no significant pain.  The patient underwent ultrasound which revealed intratesticular findings consistent with testicular cancer.  Serum markers including LDH, alpha-fetoprotein and beta hCG were all negative.  The patient has an ipsilateral right inguinal hernia with symptoms.  He has been referred to Dr. Zella Richer, who has recommended inguinal hernia repair concurrently with his inguinal orchiectomy.  The patient is aware of the risks and complications of both of these procedures.  He desires to proceed.  Description of procedure: The patient was properly identified and marked in the holding area.  He received preoperative IV antibiotics.  He was taken the operating room where general anesthesia was administered with the LMA.  He was placed in the recumbent position.  Genitalia, perineum and lower abdomen were prepped and draped.  Proper timeout was performed.  A right inguinal incision was made in an oblique fashion.  This was carried down into the subcutaneous tissue and down to the external oblique fascia with electrocautery.  The fascia was skeletonized.  The external inguinal ring was identified.  The external fascia was then incised along the course of the incision.  The spermatic cord was easily identified inferiorly.  This was circumferentially dissected.  A Penrose drain was placed for retraction.  Dissection was then carried inferiorly along the spermatic cord.  The right testicle was delivered into the wound by pressure on  the scrotum.  The gubernaculum was divided with electrocautery.  Small bleeders were carefully electrocoagulated.  Dissection was then carried up to the proximal spermatic cord which was skeletonized.  Careful attention was paid to avoid injury to the ilioinguinal or genitofemoral nerve.  The spermatic cord was clamped as high as possible, and then divided.  Inferior to the [facility] clamp.  The spermatic cord was doubly ligated with 0 silk ties.  At this point, the inguinal orchiectomy was completed.  There was some worry about a femoral/inguinal lymph node which was enlarged.  Careful dissection down to this area revealed no evident adenopathy.  Dr. Zella Richer then commenced with inguinal hernia repair.  That will be dictated in a separate note.  The patient tolerated his inguinal orchiectomy without problems.

## 2016-07-11 NOTE — H&P (View-Only) (Signed)
Ray Roberts 07/03/2016 11:20 AM Location: [facility] Patient #: [id number] DOB: [birth year] Married / Language: English / Race: Refused to Report/Unreported Male  History of Present Illness [name] MD; 07/03/2016 12:14 PM) Patient words: np.  The patient is a 51 year old male.   Note:He is referred by Dr. Diona Fanti for consultation regarding a right inguinal hernia. He is known about the hernia for about 2 years. However, for about a year, he noted a enlargement of the right testicle. He saw his primary care physician Dr. Joylene Draft who referred him to Dr. Diona Fanti. A right testicular mass was noted and was suspicious for malignancy based on ultrasound and clinical criteria. Radical orchiectomy was recommended. He is also noted to have a right inguinal hernia at that time. He states the hernia has been asymptomatic. He also reports an asymptomatic umbilical hernia.  Past Surgical History Yehuda Mao, RMA; 07/03/2016 11:21 AM) No pertinent past surgical history  Allergies Shirlean Mylar Gwynn, RMA; 07/03/2016 11:23 AM) Sulfacetamide *CHEMICALS*  Medication History Shirlean Mylar Gwynn, RMA; 07/03/2016 11:23 AM) Atorvastatin Calcium (20MG  Tablet, Oral) Active. ZyrTEC Allergy (10MG  Tablet, Oral) Active. Medications Reconciled  Social History Yehuda Mao, RMA; 07/03/2016 11:23 AM) Alcohol use Moderate alcohol use. Caffeine use Coffee. No drug use  Family History Yehuda Mao, RMA; 07/03/2016 11:23 AM) Arthritis Mother. Cancer Father. Cerebrovascular Accident Daughter. Diabetes Mellitus Mother. Heart Disease Father. Heart disease in male family member before age 31 Hypertension Mother. Melanoma Father, Mother.  Other Problems Yehuda Mao, RMA; 07/03/2016 11:23 AM) Cancer Diverticulosis Gastroesophageal Reflux Disease Hypercholesterolemia     Review of Systems (Robin Gwynn RMA; 07/03/2016 11:23 AM) General Not Present- Appetite Loss, Chills, Fatigue,  Fever, Night Sweats, Weight Gain and Weight Loss. Skin Not Present- Change in Wart/Mole, Dryness, Hives, Jaundice, New Lesions, Non-Healing Wounds, Rash and Ulcer. HEENT Not Present- Earache, Hearing Loss, Hoarseness, Nose Bleed, Oral Ulcers, Ringing in the Ears, Seasonal Allergies, Sinus Pain, Sore Throat, Visual Disturbances, Wears glasses/contact lenses and Yellow Eyes. Respiratory Not Present- Bloody sputum, Chronic Cough, Difficulty Breathing, Snoring and Wheezing. Breast Not Present- Breast Mass, Breast Pain, Nipple Discharge and Skin Changes. Cardiovascular Not Present- Chest Pain, Difficulty Breathing Lying Down, Leg Cramps, Palpitations, Rapid Heart Rate, Shortness of Breath and Swelling of Extremities. Gastrointestinal Not Present- Abdominal Pain, Bloating, Bloody Stool, Change in Bowel Habits, Chronic diarrhea, Constipation, Difficulty Swallowing, Excessive gas, Gets full quickly at meals, Hemorrhoids, Indigestion, Nausea, Rectal Pain and Vomiting. Male Genitourinary Not Present- Blood in Urine, Change in Urinary Stream, Frequency, Impotence, Nocturia, Painful Urination, Urgency and Urine Leakage. Musculoskeletal Not Present- Back Pain, Joint Pain, Joint Stiffness, Muscle Pain, Muscle Weakness and Swelling of Extremities. Neurological Not Present- Decreased Memory, Fainting, Headaches, Numbness, Seizures, Tingling, Tremor, Trouble walking and Weakness. Psychiatric Not Present- Anxiety, Bipolar, Change in Sleep Pattern, Depression, Fearful and Frequent crying. Endocrine Not Present- Cold Intolerance, Excessive Hunger, Hair Changes, Heat Intolerance, Hot flashes and New Diabetes. Hematology Not Present- Blood Thinners, Easy Bruising, Excessive bleeding, Gland problems, HIV and Persistent Infections.  Vitals (Robin Gwynn RMA; 07/03/2016 11:24 AM) 07/03/2016 11:23 AM Weight: 164 lb Height: 67in Body Surface Area: 1.86 m Body Mass Index: 25.69 kg/m  Pulse: 91 (Regular)  BP: 142/90  (Sitting, Left Arm, Standard)      Physical Exam [name] MD; 07/03/2016 12:16 PM)  The physical exam findings are as follows: Note:GENERAL APPEARANCE: WDWN in NAD. Pleasant and cooperative.  EARS, NOSE, MOUTH THROAT: [place]/AT external ears: no lesions or deformities external nose: no lesions  or deformities hearing: grossly normal lips: moist, no deformities EYES external: conjunctiva, lids, sclerae normal pupils: equal, round glasses: no  NECK: Supple, no obvious mass or thyroid mass/enlargement, no trachea deviation  CV ascultation: RRR, no murmur extremity edema: no   RESP auscultation: breath sounds equal and clear respiratory effort: normal   GASTROINTESTINAL abdomen: Soft, non-tender, non-distended, no masses liver and spleen: not enlarged. hernia: Right inguinal bulge consistent with hernia; no left inguinal bulge; reducible umbilical hernia scar: none present   GENITOURINARY scrotum: Hard right testicular mass; enlarged lymph node/soft tissue area right groin penis: no lesions  MUSCULOSKELETAL station and gait: normal digits/nails: no clubbing or cyanosis muscle strength: grossly normal all extremities deformities: none instability: none  NEUROLOGIC sensation: intact to touch   PSYCHIATRIC alertness and orientation: normal mood/affect/behavior: normal judgement and insight: normal    Assessment & Plan [name] MD; 07/03/2016 12:17 PM)  INGUINAL HERNIA OF RIGHT SIDE WITHOUT OBSTRUCTION OR GANGRENE (K40.90) Impression: This has not been symptomatic. However, he is going to have a radical right orchiectomy and the hernia should be repaired same time in my opinion. He also has what appears to be an enlarged right inguinal lymph node that may need to be removed and we discussed this..  Plan: Open right inguinal hernia with mesh in coordination with radical right orchiectomy. I have explained the procedure, risks, and aftercare of  inguinal hernia repair. Risks include but are not limited to bleeding, infection, wound problems, anesthesia, recurrence, bladder or intestine injury, urinary retention, testicular dysfunction, chronic pain, mesh problems. He seems to understand and agrees with the plan. My office will coordinate this with Dr. Alan Ripper office.  Jackolyn Confer, M.D.

## 2016-07-11 NOTE — Anesthesia Preprocedure Evaluation (Addendum)
Anesthesia Evaluation  Patient identified by MRN, date of birth, ID band Patient awake    Reviewed: Allergy & Precautions, NPO status , Patient's Chart, lab work & pertinent test results  Airway Mallampati: I  TM Distance: >3 FB Neck ROM: Full    Dental no notable dental hx. (+) Teeth Intact, Caps   Pulmonary neg pulmonary ROS,    Pulmonary exam normal breath sounds clear to auscultation       Cardiovascular negative cardio ROS Normal cardiovascular exam Rhythm:Regular Rate:Normal     Neuro/Psych negative neurological ROS  negative psych ROS   GI/Hepatic Neg liver ROS, hiatal hernia, GERD  Medicated and Controlled,  Endo/Other  Hyperlipidemia  Renal/GU negative Renal ROS  negative genitourinary   Musculoskeletal Right inguinal hernia   Abdominal   Peds  Hematology negative hematology ROS (+)   Anesthesia Other Findings   Reproductive/Obstetrics Right Testicular mass                             Anesthesia Physical Anesthesia Plan  ASA: II  Anesthesia Plan: General and Regional   Post-op Pain Management:  Regional for Post-op pain   Induction: Intravenous  Airway Management Planned: Oral ETT  Additional Equipment:   Intra-op Plan:   Post-operative Plan: Extubation in OR  Informed Consent: I have reviewed the patients History and Physical, chart, labs and discussed the procedure including the risks, benefits and alternatives for the proposed anesthesia with the patient or authorized representative who has indicated his/her understanding and acceptance.   Dental advisory given  Plan Discussed with: CRNA, Anesthesiologist and Surgeon  Anesthesia Plan Comments:         Anesthesia Quick Evaluation

## 2016-07-11 NOTE — Op Note (Signed)
OPERATIVE NOTE-INGUINAL HERNIA REPAIR  Preoperative diagnosis:  Right inguinal hernia.  Postoperative diagnosis:  Same  Procedure:  Right inguinal hernia repair with mesh.  Surgeon:  Jackolyn Confer, M.D.  Anesthesia:  General, TAP block, local (Marcaine).  Indication:  This is a 51 year old male who has a testicular mass as well as a right inguinal hernia. He now presents for right radical orchiectomy by Dr. Diona Fanti and right inguinal hernia repair with mesh.  EBL < 100 ml  Technique:  He was seen in the holding room and the right groin was marked with my initials. He was brought to the operating, placed supine on the operating table, and the anesthetic was administered by the anesthesiologist. The hair in the groin area was clipped as was felt to be necessary. This area was then sterilely prepped and draped. A timeout was performed.  Local anesthetic was infiltrated in the superficial and deep tissues in the right groin.  An incision was made through the skin and subcutaneous tissue until the external oblique aponeurosis was identified.  Local anesthetic was infiltrated deep to the external oblique aponeurosis. The external oblique aponeurosis was divided through the external ring medially and back toward the anterior superior iliac spine laterally. Using blunt dissection, the shelving edge of the inguinal ligament was identified inferiorly and the internal oblique aponeurosis and muscle were identified superiorly. The ilioinguinal nerve was identified and preserved.  The spermatic cord was isolated and a posterior window was made around it. A direct hernia sac was identified and separated from the spermatic cord using blunt dissection.  Dr. Diona Fanti then performed a right radical orchiectomy. After that, I closed the internal ring with interrupted 3-0 Vicryl suture.   A piece of 3" x 6" polypropylene mesh, that had been soaked in antibiotic solution, was brought into the field and  anchored 1-2 cm medial to the pubic tubercle with 2-0 Prolene suture. The inferior aspect of the mesh was anchored to the shelving edge of the inguinal ligament with running 2-0 Prolene suture to a level 1-2 cm lateral to the internal ring. The superior aspect of the mesh was anchored to the internal oblique aponeurosis and muscle with interrupted 2-0 Vicryl sutures. The lateral aspect of the mesh was then tucked deep to the external oblique aponeurosis.  The wound was irrigated, inspected, and hemostasis was adequate. The external oblique aponeurosis was then closed over the mesh and cord with running 3-0 Vicryl suture. The subcutaneous tissue was closed with running 3-0 Vicryl suture. The skin closed with a running 4-0 Monocryl subcuticular stitch.  Steri-Strips and a sterile dressing were applied.  The procedure was well-tolerated without any apparent complications and the he was taken to the recovery room in satisfactory condition.

## 2016-07-11 NOTE — Progress Notes (Signed)
AssistedDr. Royce Macadamia w/trans abdominal plane block. Side rails up, monitors on throughout procedure. See vital signs in flow sheet. Tolerated Procedure well.

## 2016-07-11 NOTE — Anesthesia Procedure Notes (Signed)
Procedure Name: Intubation Date/Time: 07/11/2016 9:17 AM Performed by: Glory Buff Pre-anesthesia Checklist: Patient identified, Emergency Drugs available, Suction available and Patient being monitored Patient Re-evaluated:Patient Re-evaluated prior to inductionOxygen Delivery Method: Circle system utilized Preoxygenation: Pre-oxygenation with 100% oxygen Intubation Type: IV induction Ventilation: Mask ventilation without difficulty Laryngoscope Size: Miller and 3 Grade View: Grade II Tube type: Oral Tube size: 7.5 mm Number of attempts: 1 Airway Equipment and Method: Stylet and Oral airway Placement Confirmation: ETT inserted through vocal cords under direct vision,  positive ETCO2 and breath sounds checked- equal and bilateral Secured at: 22 cm Tube secured with: Tape Dental Injury: Teeth and Oropharynx as per pre-operative assessment

## 2016-07-11 NOTE — Anesthesia Postprocedure Evaluation (Addendum)
Anesthesia Post Note  Patient: Ray Roberts  Procedure(s) Performed: Procedure(s) (LRB): RIGHT INGUINAL HERNIA REPAIR (Right) INSERTION OF MESH (Right) RIGHT RADICAL ORCHIECTOMY (Right)  Patient location during evaluation: PACU Anesthesia Type: Regional Level of consciousness: awake and sedated Pain management: pain level controlled Vital Signs Assessment: post-procedure vital signs reviewed and stable Respiratory status: spontaneous breathing Cardiovascular status: stable Postop Assessment: no signs of nausea or vomiting Anesthetic complications: no        Last Vitals:  Vitals:   07/11/16 1115 07/11/16 1130  BP: (!) 139/99 (!) 143/88  Pulse: 88 93  Resp: 12 12  Temp:  36.5 C    Last Pain:  Vitals:   07/11/16 1130  TempSrc:   PainSc: 2    Pain Goal: Patients Stated Pain Goal: 4 (07/11/16 0834)               Massai Hankerson JR,JOHN Mateo Flow

## 2016-07-11 NOTE — Anesthesia Procedure Notes (Addendum)
Anesthesia Regional Block: TAP block   Pre-Anesthetic Checklist: ,, timeout performed, Correct Patient, Correct Site, Correct Laterality, Correct Procedure, Correct Position, site marked, Risks and benefits discussed,  Surgical consent,  Pre-op evaluation,  At surgeon's request and post-op pain management  Laterality: Right  Prep: chloraprep       Needles:  Injection technique: Single-shot  Needle Type: Echogenic Stimulator Needle     Needle Length: 9cm  Needle Gauge: 21   Needle insertion depth: 5 cm   Additional Needles:   Procedures: ultrasound guided,,,,,,,,  Narrative:  Start time: 07/11/2016 8:49 AM End time: 07/11/2016 8:54 AM Injection made incrementally with aspirations every 5 mL.  Performed by: Personally  Anesthesiologist: Josephine Igo  Additional Notes: Timeout performed. Patient sedated. Relevant anatomy ID'd using [place]. Incremental 2-51ml injection with frequent aspiration. Patient tolerated procedure well.

## 2016-07-11 NOTE — Interval H&P Note (Signed)
History and Physical Interval Note:  07/11/2016 8:56 AM  Ray Roberts  has presented today for surgery, with the diagnosis of right inguinal hernia RIGHT TESTICLE MASS  The various methods of treatment have been discussed with the patient and family. After consideration of risks, benefits and other options for treatment, the patient has consented to  Procedure(s): RIGHT INGUINAL HERNIA REPAIR (Right) INSERTION OF MESH (Right) RIGHT RADICAL ORCHIECTOMY (Right) as a surgical intervention .  The patient's history has been reviewed, patient examined, no change in status, stable for surgery.  I have reviewed the patient's chart and labs.  Questions were answered to the patient's satisfaction.     Annie Roseboom Lenna Sciara

## 2016-07-11 NOTE — Discharge Instructions (Signed)
1.  Keep ice on groin wound and over right scrotum.  2.  Where tight underwear/jockstrap for several days.  3.  Follow Dr. Bertrum Sol instructions for activity restrictions  4.  Dr. Diona Fanti will call with results of the specimen as well as follow-up radiographic appointments and office follow-up   [facility] _______[facility]   INGUINAL HERNIA REPAIR: POST OP INSTRUCTIONS  Always review your discharge instruction sheet given to you by the facility where your surgery was performed. IF YOU HAVE DISABILITY OR FAMILY LEAVE FORMS, YOU MUST BRING THEM TO THE OFFICE FOR PROCESSING.   DO NOT GIVE THEM TO YOUR DOCTOR.  1. A  prescription for pain medication may be given to you upon discharge.  Take your pain medication as prescribed, if needed.  If narcotic pain medicine is not needed, then you may take acetaminophen (Tylenol) or ibuprofen (Advil) as needed. 2. Take your usually prescribed medications unless otherwise directed. 3. If you need a refill on your pain medication, please contact your pharmacy.  They will contact our office to request authorization. Prescriptions will not be filled after 5 pm or on week-ends. 4. You should follow a light diet the first 24 hours after arrival home, such as soup and crackers, etc.  Be sure to include lots of fluids daily.  Resume your normal diet the day after surgery. 5. Most patients will experience some swelling and bruising in the groin area (and scrotum in men).  Ice packs and reclining will help.  Swelling and bruising can take many days to resolve.  6. It is common to experience some constipation if taking pain medication after surgery.  Increasing fluid intake and taking a stool softener (such as Colace) will usually help or prevent this problem from occurring.  A mild laxative (Milk of Magnesia or Miralax) should be taken according to package directions if there are no bowel movements after 48 hours. 7. Unless discharge instructions  indicate otherwise, you may remove your bandages 4 days after surgery, and you may shower at that time.  You may have steri-strips (small skin tapes) in place directly over the incision.  These strips should be left on the skin.  If your surgeon used skin glue on the incision, you may shower in 24 hours.  The glue will flake off over the next 2-3 weeks.  Any sutures or staples will be removed at the office during your follow-up visit. 8. ACTIVITIES:  You may resume regular (light) daily activities beginning the next day--such as daily self-care, walking, climbing stairs--gradually increasing activities as tolerated.  You may have sexual intercourse when it is comfortable.  Refrain from any heavy lifting or straining-nothing over 10 pounds for 6 weeks.  a. You may drive when you are no longer taking prescription pain medication, you can comfortably wear a seatbelt, and you can safely maneuver your car and apply brakes. b. RETURN TO WORK:  Desk work/Light work in 1-2 weeks, full duty in 6 weeks._________________________________________________________ 9. You should see your doctor in the office for a follow-up appointment approximately 2-3 weeks after your surgery.  Make sure that you call for this appointment within a day or two after you arrive home to insure a convenient appointment time. 10. OTHER INSTRUCTIONS:  __________________________________________________________________________________________________________________________________________________________________________________________  WHEN TO CALL YOUR DOCTOR: 1. Fever over 101.0 2. Inability to urinate 3. Nausea and/or vomiting 4. Extreme swelling or bruising 5. Continued bleeding from incision. 6. Increased pain, redness, or drainage from the incision  The clinic staff  is available to answer your questions during regular business hours.  Please dont hesitate to call and ask to speak to one of the nurses for clinical concerns.  If you  have a medical emergency, go to the nearest emergency room or call 911.  A surgeon from [facility] is always on call at the hospital   [address] [place], [place], [place]  [address] ?  [address], [place], [place]   [address] [phone] ? [phone] ? FAX [phone] Web site: [email or web address]

## 2016-07-11 NOTE — Transfer of Care (Signed)
Immediate Anesthesia Transfer of Care Note  Patient: Ray Roberts  Procedure(s) Performed: Procedure(s) with comments: RIGHT INGUINAL HERNIA REPAIR (Right) - Tap Block INSERTION OF MESH (Right) - Tap block RIGHT RADICAL ORCHIECTOMY (Right)  Patient Location: PACU  Anesthesia Type:General  Level of Consciousness: awake, alert  and oriented  Airway & Oxygen Therapy: Patient Spontanous Breathing and Patient connected to face mask oxygen  Post-op Assessment: Report given to RN and Post -op Vital signs reviewed and stable  Post vital signs: Reviewed and stable  Last Vitals:  Vitals:   07/11/16 0904 07/11/16 0905  BP:  117/77  Pulse: 86 91  Resp:    Temp:      Last Pain:  Vitals:   07/11/16 0710  TempSrc: Oral      Patients Stated Pain Goal: 4 (42/39/53 2023)  Complications: No apparent anesthesia complications

## 2016-07-22 ENCOUNTER — Other Ambulatory Visit: Payer: Self-pay | Admitting: Urology

## 2016-07-22 ENCOUNTER — Ambulatory Visit (HOSPITAL_COMMUNITY)
Admission: RE | Admit: 2016-07-22 | Discharge: 2016-07-22 | Disposition: A | Discharge status: Home / Self Care | Payer: 59 | Source: Ambulatory Visit | Attending: Urology | Admitting: Urology

## 2016-07-22 DIAGNOSIS — C6210 Malignant neoplasm of unspecified descended testis: Secondary | ICD-10-CM

## 2016-07-22 DIAGNOSIS — C6290 Malignant neoplasm of unspecified testis, unspecified whether descended or undescended: Secondary | ICD-10-CM | POA: Diagnosis not present

## 2016-07-26 DIAGNOSIS — C6210 Malignant neoplasm of unspecified descended testis: Secondary | ICD-10-CM | POA: Diagnosis not present

## 2016-07-26 DIAGNOSIS — K5730 Diverticulosis of large intestine without perforation or abscess without bleeding: Secondary | ICD-10-CM | POA: Diagnosis not present

## 2016-08-01 ENCOUNTER — Encounter: Payer: Self-pay | Admitting: Radiation Oncology

## 2016-08-05 NOTE — Progress Notes (Addendum)
Histology and Location of Primary Cancer: Patient was seen by PCP Dr. Joylene Draft and was referred to Dr. Diona Fanti.  Patient was aware of Inginual hernia for approximately 2 years.  He noticed right testicle enlargement and had ultrasound that indicated suspicion for malignancy of right testicle.  Patient was referred to Dr. Perley Jain for radical orchiectomy and hernia repair ([place]).     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 - FOCAL LYMPHOVASCULAR SPACE INVOLVEMENT BY TUMOR. - EPIDIDYMIS, SPERMATIC CORD AND SPERMATIC CORD MARGIN FREE OF TUMOR  Past/Anticipated chemotherapy by medical oncology, if any: None  Patient's main complaints related to symptomatic tumor(s) are: Denies  Pain on a scale of 0-10 is: Denies    If Spine Met(s), symptoms, if any, include:  Bowel/Bladder retention or incontinence (please describe): denies  Numbness or weakness in extremities (please describe): denies Current Decadron regimen, if applicable: No  Ambulatory status? Walker? Wheelchair?: Ambulatory  SAFETY ISSUES:  Prior radiation? Yes, [facility]; Dr. Gerarda Fraction Radioactive Plaque Application to right eye for malignant melanoma of right choroid 70 cGy/hr 125 Iodine Sources in 06/27/2014  Pacemaker/ICD? No  Possible current pregnancy? No  Is the patient on methotrexate? No  Additional Complaints / other details:  51 year old male. Married. 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   Beta HCG <1 AFP, Tumor Marker 4.5  Scheduled to follow up with Dr. Cordelia Pen about his eye on June 3rd. Reports the melanoma was found during his first eye exam and he was asymptomatic.   Reports he does not wish to have any children in the future.

## 2016-08-06 DIAGNOSIS — C6291 Malignant neoplasm of right testis, unspecified whether descended or undescended: Secondary | ICD-10-CM | POA: Insufficient documentation

## 2016-08-07 ENCOUNTER — Ambulatory Visit
Admission: RE | Admit: 2016-08-07 | Discharge: 2016-08-07 | Disposition: A | Discharge status: Home / Self Care | Payer: 59 | Source: Ambulatory Visit | Attending: Radiation Oncology | Admitting: Radiation Oncology

## 2016-08-07 ENCOUNTER — Encounter: Payer: Self-pay | Admitting: Radiation Oncology

## 2016-08-07 DIAGNOSIS — E7800 Pure hypercholesterolemia, unspecified: Secondary | ICD-10-CM | POA: Diagnosis not present

## 2016-08-07 DIAGNOSIS — Z923 Personal history of irradiation: Secondary | ICD-10-CM | POA: Diagnosis not present

## 2016-08-07 DIAGNOSIS — C6200 Malignant neoplasm of unspecified undescended testis: Secondary | ICD-10-CM

## 2016-08-07 DIAGNOSIS — Z9079 Acquired absence of other genital organ(s): Secondary | ICD-10-CM | POA: Diagnosis not present

## 2016-08-07 DIAGNOSIS — Z85820 Personal history of malignant melanoma of skin: Secondary | ICD-10-CM | POA: Diagnosis not present

## 2016-08-07 DIAGNOSIS — C6291 Malignant neoplasm of right testis, unspecified whether descended or undescended: Secondary | ICD-10-CM | POA: Diagnosis present

## 2016-08-07 DIAGNOSIS — Z79899 Other long term (current) drug therapy: Secondary | ICD-10-CM | POA: Diagnosis not present

## 2016-08-07 DIAGNOSIS — Z8042 Family history of malignant neoplasm of prostate: Secondary | ICD-10-CM | POA: Diagnosis not present

## 2016-08-07 DIAGNOSIS — Z801 Family history of malignant neoplasm of trachea, bronchus and lung: Secondary | ICD-10-CM | POA: Insufficient documentation

## 2016-08-07 DIAGNOSIS — K219 Gastro-esophageal reflux disease without esophagitis: Secondary | ICD-10-CM | POA: Diagnosis not present

## 2016-08-07 DIAGNOSIS — Z882 Allergy status to sulfonamides status: Secondary | ICD-10-CM | POA: Diagnosis not present

## 2016-08-07 HISTORY — DX: Diverticulitis of intestine, part unspecified, without perforation or abscess without bleeding: K57.92

## 2016-08-07 NOTE — Progress Notes (Signed)
Radiation Oncology         [phone] ________________________________  Initial outpatient Consultation  Name: Ray Roberts MRN: [id number]  Date: 08/07/2016  DOB: 06-May-1965  GE:XBMWUX, [name], MD  Franchot Gallo, MD   REFERRING PHYSICIAN: Franchot Gallo, MD  DIAGNOSIS: The encounter diagnosis was Testicular seminoma, right [facility]).    ICD-9-CM ICD-10-CM   1. Testicular seminoma, right (HCC) 186.9 C62.91    51 y.o. gentleman with a Stage 1B, pT2NxM0S0 right testicular seminoma  HISTORY OF PRESENT ILLNESS: Ray Roberts is a 51 y.o. male seen at the request of Dr. Diona Fanti for a newly diagnosed right testicular seminoma.  He initially presented to his PCP, Dr. Abner Greenspan, in 05/2016 with complaints of an enlarged right testicle.  Dr. Abner Greenspan kindly referred the patient to Dr. Diona Fanti for further evaluation and management.  A frim, palpable mass was noted on the right testicle on exam.  He proceeded to have a scrotal U/S for further evaluation and this was consistent with intratesticular findings suspicious for testicular cancer.  Serum tumor markers (LDH, AFP and b-hCG) were negative.  He had a known right inguinal hernia which had been present for at least 2 years but not bothersome.  Recommendation was to proceed with a radical right orchiectomy for definitive diagnosis and he therefore elected to have the right inguinal hernia repaired at the time of the right inguinal orchiectomy on 07/11/16.    He had a CT A/P on 07/26/16 which did not demonstrate any pelvic or abdominal lymphademopathy and no evidence of metastatic disease.  The patient presents today in radiation oncology to discuss radiation for the management of his disease given he has focal lymphovascular space involvement by the tumor.  PREVIOUS RADIATION THERAPY: Yes. [place]; Dr. Gerarda Fraction Radioactive Plaque Application to right eye for malignant melanoma of right choroid 70 cGy/hr 125 Iodine  Sources in 06/27/2014  PAST MEDICAL HISTORY:  Past Medical History:  Diagnosis Date  . Diverticulitis   . Elevated cholesterol   . GERD (gastroesophageal reflux disease)   . History of hiatal hernia   . Hx of seasonal allergies   . Testicular mass    right      PAST SURGICAL HISTORY: Past Surgical History:  Procedure Laterality Date  . INGUINAL HERNIA REPAIR Right 07/11/2016   Procedure: RIGHT INGUINAL HERNIA REPAIR;  Surgeon: Jackolyn Confer, MD;  Location: [facility];  Service: General;  Laterality: Right;  Tap Block  . INSERTION OF MESH Right 07/11/2016   Procedure: INSERTION OF MESH;  Surgeon: Jackolyn Confer, MD;  Location: [facility];  Service: General;  Laterality: Right;  Tap block  . ORCHIECTOMY Right 07/11/2016   Procedure: RIGHT RADICAL ORCHIECTOMY;  Surgeon: Franchot Gallo, MD;  Location: [facility];  Service: Urology;  Laterality: Right;  . radiation seed pack  2016   for mole on back of right eye    FAMILY HISTORY:  Family History  Problem Relation Age of Onset  . Cancer Father     lung/smoker  . Cancer Paternal Grandfather     prostate    SOCIAL HISTORY:  Social History   Social History  . Marital status: Married    Spouse name: N/A  . Number of children: N/A  . Years of education: N/A   Occupational History  . Not on file.   Social History Main Topics  . Smoking status: Never Smoker  . Smokeless tobacco: Never Used  . Alcohol use Yes     Comment:  15 cans beer/week  . Drug use: No  . Sexual activity: Yes   Other Topics Concern  . Not on file   Social History Narrative  . No narrative on file    ALLERGIES: Sulfa antibiotics  MEDICATIONS:  Current Outpatient Prescriptions  Medication Sig Dispense Refill  . atorvastatin (LIPITOR) 20 MG tablet Take 20 mg by mouth every evening.    . cetirizine (ZYRTEC) 10 MG tablet Take 10 mg by mouth every evening.    . diphenhydramine-acetaminophen (TYLENOL PM) 25-500 MG TABS tablet Take 1-2 tablets by mouth at  bedtime as needed (for pain/sleep).    . famotidine (PEPCID) 20 MG tablet Take 20 mg by mouth daily as needed for heartburn or indigestion.    . fluticasone (FLONASE) 50 MCG/ACT nasal spray Place 1-2 sprays into both nostrils daily as needed for allergies.     No current facility-administered medications for this encounter.     REVIEW OF SYSTEMS:  On review of systems, the patient reports that he is doing well overall. He has noted some numbness in the right inguinal region since his surgery but this is not worsening or radiating into the RLE.  He denies any chest pain, shortness of breath, cough, fevers, chills, night sweats, unintended weight changes. He denies any bowel or bladder disturbances, and denies abdominal pain, nausea or vomiting. He denies any new musculoskeletal or joint aches or pains. A complete review of systems is obtained and is otherwise negative.    PHYSICAL EXAM:  Wt Readings from Last 3 Encounters:  08/07/16 160 lb 6.4 oz (72.8 kg)  07/11/16 162 lb (73.5 kg)  07/09/16 162 lb 12.8 oz (73.8 kg)   Temp Readings from Last 3 Encounters:  08/07/16 98 F (36.7 C) (Oral)  07/11/16 97.8 F (36.6 C) (Oral)  07/09/16 98.4 F (36.9 C) (Oral)   BP Readings from Last 3 Encounters:  08/07/16 129/88  07/11/16 (!) 147/91  07/09/16 138/89   Pulse Readings from Last 3 Encounters:  08/07/16 78  07/11/16 97  07/09/16 89   Pain Assessment Pain Score: 0-No pain/10  In general this is a well appearing Caucasian male in no acute distress. He is alert and oriented x4 and appropriate throughout the examination. HEENT reveals that the patient is normocephalic, atraumatic. EOMs are intact. PERRLA. Skin is intact without any evidence of gross lesions. Cardiovascular exam reveals a regular rate and rhythm, no clicks rubs or murmurs are auscultated. Chest is clear to auscultation bilaterally. Lymphatic assessment is performed and does not reveal any adenopathy in the cervical,  supraclavicular, axillary, or inguinal chains. Abdomen has active bowel sounds in all quadrants and is intact. The abdomen is soft, non tender, non distended. Lower extremities are negative for pretibial pitting edema, deep calf tenderness, cyanosis or clubbing.   KPS = 100  100 - Normal; no complaints; no evidence of disease. 90   - Able to carry on normal activity; minor signs or symptoms of disease. 80   - Normal activity with effort; some signs or symptoms of disease. 19   - Cares for self; unable to carry on normal activity or to do active work. 60   - Requires occasional assistance, but is able to care for most of his personal needs. 50   - Requires considerable assistance and frequent medical care. 26   - Disabled; requires special care and assistance. 73   - Severely disabled; hospital admission is indicated although death not imminent. 20   - Very sick;  hospital admission necessary; active supportive treatment necessary. 10   - Moribund; fatal processes progressing rapidly. 0     - Dead  Karnofsky DA, Abelmann [place], Craver LS and Burchenal [facility] [phone]) The use of the nitrogen mustards in the palliative treatment of carcinoma: with particular reference to bronchogenic carcinoma Cancer 1 634-56  LABORATORY DATA:  Lab Results  Component Value Date   WBC 7.7 07/09/2016   HGB 14.4 07/09/2016   HCT 42.4 07/09/2016   MCV 87.6 07/09/2016   PLT 395 07/09/2016   Lab Results  Component Value Date   NA 136 07/09/2016   K 4.5 07/09/2016   CL 103 07/09/2016   CO2 27 07/09/2016   No results found for: ALT, AST, GGT, ALKPHOS, BILITOT   RADIOGRAPHY: Dg Chest 2 View  Result Date: 07/22/2016 CLINICAL DATA:  Testicular carcinoma EXAM: CHEST  2 VIEW COMPARISON:  None. FINDINGS: The lungs are clear. The heart size and pulmonary vascularity are normal. No adenopathy. No bone lesions. IMPRESSION: No abnormality noted. Electronically Signed   By: Lowella Grip III M.D.   On: 07/22/2016 08:28       IMPRESSION/PLAN: 1. 51 y.o. gentleman with a Stage 1B, pT2NxM0S0 right testicular seminoma  The patient's tumor was confined in the tunica albuginea and his epdidymis, spermatic cord, and spermatic cord margin were free of tumor. However, there was focal lymphovascular space involvement by tumor. The patient has a variety of treatment options for the management of his disease including observation with surveillance imaging by his PCP/Urology, completing a single round of chemotherapy or external beam radiation to the periaortic nodes to significantly lower his risk of disease recurrence.  Today, we spoke to the patient about the findings and work-up thus far.  We discussed the natural history of testicular seminoma and general treatment, highlighting the role of radiotherapy in the management.  We discussed the available radiation techniques, and focused on the details of logistics and delivery.  We would anticipate a 2 week course of EBRT in 10 fractions for a total of 20 Gy.  We reviewed the anticipated acute and late sequelae associated with radiation in this setting.  The patient was encouraged to ask questions that we answered to the best of our ability.  The patient is undecided as to whether he would like to proceed with external beam radiation or continued close observation with surveillence imaging. He will call us within the next week regarding his final decision.  2. Genetic Counseling  The patient has a prior personal history of malignant melanoma of the right choroid and a family history of cancer (his father had lung cancer and paternal grandfather had prostate cancer). The presence of a second incidence of cancer and the patient's relatively young age might make him a candidate for genetic counseling.    Nicholos Johns, PA-C    Tyler Pita, MD  [facility] Direct Dial: [phone]  Fax: [phone] [email or web address]  Skype  [email or web address]  This  document serves as a record of services personally performed by Freeman Caldron, PA-C and Tyler Pita, MD. It was created on their behalf by Darcus Austin, a trained medical scribe. The creation of this record is based on the scribe's personal observations and the providers' statements to them. This document has been checked and approved by the attending provider.

## 2016-08-07 NOTE — Progress Notes (Signed)
See progress note under physician encounter. 

## 2016-09-03 DIAGNOSIS — C6931 Malignant neoplasm of right choroid: Secondary | ICD-10-CM | POA: Diagnosis not present

## 2016-09-03 DIAGNOSIS — H2513 Age-related nuclear cataract, bilateral: Secondary | ICD-10-CM | POA: Diagnosis not present

## 2016-09-03 DIAGNOSIS — H3561 Retinal hemorrhage, right eye: Secondary | ICD-10-CM | POA: Diagnosis not present

## 2016-09-03 DIAGNOSIS — T66XXXD Radiation sickness, unspecified, subsequent encounter: Secondary | ICD-10-CM | POA: Diagnosis not present

## 2016-09-04 ENCOUNTER — Telehealth: Payer: Self-pay | Admitting: Urology

## 2016-09-04 NOTE — Telephone Encounter (Signed)
I called to follow up with [name] regarding his decision for treatment vs continued observation.  He reports that he would like to come back in for further discussion with Dr. Tammi Klippel before making his final decision.  I also informed him that I had communicated with Roma Kayser in [place] and she could not come up with anything specific for this gentleman.  While we could offer STK11 testing, the likelihood that it would be positive is low, as there are no other symptoms or issues in the family that suggest that this is what is going on.  I advised that we would be back in contact soon to schedule his follow up appointment with Dr. Tammi Klippel.

## 2016-09-05 ENCOUNTER — Telehealth: Payer: Self-pay | Admitting: *Deleted

## 2016-09-05 NOTE — Telephone Encounter (Signed)
CALLED PATIENT TO INFORM OF [facility] APPT. WITH ASHLYN BRUNING ON 10-17-16, LVM FOR A RETURN CALL

## 2016-09-06 NOTE — Addendum Note (Signed)
Addendum  created 09/06/16 1157 by Lyn Hollingshead, MD   Sign clinical note

## 2016-10-15 ENCOUNTER — Encounter: Payer: Self-pay | Admitting: Radiation Oncology

## 2016-10-17 ENCOUNTER — Ambulatory Visit: Payer: Self-pay | Admitting: Urology

## 2016-10-17 NOTE — Progress Notes (Signed)
Histology and Location of Primary Cancer: Patient was seen by PCP Dr. Joylene Draft and was referred to Dr. Diona Fanti.  Patient was aware of Inginual hernia for approximately 2 years.  He noticed right testicle enlargement and had ultrasound that indicated suspicion for malignancy of right testicle.  Patient was referred to Dr. Perley Jain for radical orchiectomy and hernia repair ([place]).     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 - FOCAL LYMPHOVASCULAR SPACE INVOLVEMENT BY TUMOR. - EPIDIDYMIS, SPERMATIC CORD AND SPERMATIC CORD MARGIN FREE OF TUMOR  Past/Anticipated chemotherapy by medical oncology, if any: None  Patient's main complaints related to symptomatic tumor(s) are: Denies  Pain on a scale of 0-10 is: Denies    If Spine Met(s), symptoms, if any, include:  Bowel/Bladder retention or incontinence (please describe): denies  Numbness or weakness in extremities (please describe): denies Current Decadron regimen, if applicable: No  Ambulatory status? Walker? Wheelchair?: Ambulatory  SAFETY ISSUES:  Prior radiation? Yes, [facility]; Dr. Gerarda Fraction Radioactive Plaque Application to right eye for malignant melanoma of right choroid 70 cGy/hr 125 Iodine Sources in 06/27/2014  Pacemaker/ICD? No  Possible current pregnancy? No  Is the patient on methotrexate? No  Additional Complaints / other details:  50 year old male. Married. 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   Beta HCG <1 AFP, Tumor Marker 4.5    Reports he does not wish to have any children in the future.  Vitals:   10/23/16 0957  BP: 127/88  Pulse: 77  Resp: 20  Temp: 97.9 F (36.6 C)  TempSrc: Oral  SpO2: 100%  Weight: 163 lb 2 oz (74 kg)    Wt Readings from Last 3 Encounters:  10/23/16 163 lb  2 oz (74 kg)  08/07/16 160 lb 6.4 oz (72.8 kg)  07/11/16 162 lb (73.5 kg)

## 2016-10-23 ENCOUNTER — Ambulatory Visit
Admission: RE | Admit: 2016-10-23 | Discharge: 2016-10-23 | Disposition: A | Discharge status: Home / Self Care | Payer: 59 | Source: Ambulatory Visit | Attending: Radiation Oncology | Admitting: Radiation Oncology

## 2016-10-23 ENCOUNTER — Ambulatory Visit
Admission: RE | Admit: 2016-10-23 | Discharge: 2016-10-23 | Disposition: A | Discharge status: Home / Self Care | Payer: 59 | Source: Ambulatory Visit | Attending: Urology | Admitting: Urology

## 2016-10-23 VITALS — BP 127/88 | HR 77 | Temp 97.9°F | Resp 20 | Wt 163.1 lb

## 2016-10-23 DIAGNOSIS — C6291 Malignant neoplasm of right testis, unspecified whether descended or undescended: Secondary | ICD-10-CM | POA: Diagnosis not present

## 2016-10-23 DIAGNOSIS — Z85820 Personal history of malignant melanoma of skin: Secondary | ICD-10-CM | POA: Diagnosis not present

## 2016-10-23 DIAGNOSIS — Z923 Personal history of irradiation: Secondary | ICD-10-CM | POA: Diagnosis not present

## 2016-10-23 NOTE — Progress Notes (Signed)
Radiation Oncology         (336) [phone] ________________________________  Follow Up New Visit  Name: Ray Roberts MRN: [id number]  Date: 10/23/2016  DOB: [birth year]  MO:LMBEML, [name], MD  Franchot Gallo, MD   REFERRING PHYSICIAN: Franchot Gallo, MD  DIAGNOSIS: The encounter diagnosis was Testicular seminoma, right [facility]).    ICD-10-CM   1. Testicular seminoma, right [facility]) C62.91    51 y.o. gentleman with a Stage 1B, pT2NxM0S0 right testicular seminoma  HISTORY OF PRESENT ILLNESS: Ray Roberts is a 51 y.o. male initially seen on 08/07/16 at the request of Dr. Diona Fanti for a newly diagnosed right testicular seminoma.    In summary, he initially presented to his PCP, Dr. Abner Greenspan, in 05/2016 with complaints of an enlarged right testicle.  Dr. Abner Greenspan kindly referred the patient to Dr. Diona Fanti for further evaluation and management.  A frim, palpable mass was noted on the right testicle on exam.  He proceeded to have a scrotal U/S for further evaluation and this was consistent with intratesticular findings suspicious for testicular cancer.  Serum tumor markers (LDH, AFP and b-hCG) were negative.  He had a known right inguinal hernia which had been present for at least 2 years but not bothersome.  Recommendation was to proceed with a radical right orchiectomy for definitive diagnosis and he therefore elected to have the right inguinal hernia repaired at the time of the right inguinal orchiectomy on 07/11/16.    He had a CT A/P on 07/26/16 which did not demonstrate any pelvic or abdominal lymphadenopathy and no evidence of metastatic disease.  At the conclusion of our last visit, the patient was undecided as to whether he would like to proceed with external beam radiation or continued close observation with surveillence imaging. He presents today in radiation oncology for further discussion of the role for radiation for the management of his disease given he has focal  lymphovascular space involvement by the tumor.  PREVIOUS RADIATION THERAPY: Yes. [place]; Dr. Gerarda Fraction Radioactive Plaque Application to right eye for malignant melanoma of right choroid 70 cGy/hr 125 Iodine Sources in 06/27/2014  PAST MEDICAL HISTORY:  Past Medical History:  Diagnosis Date  . Diverticulitis   . Elevated cholesterol   . GERD (gastroesophageal reflux disease)   . History of hiatal hernia   . Hx of seasonal allergies   . Testicular mass    right      PAST SURGICAL HISTORY: Past Surgical History:  Procedure Laterality Date  . INGUINAL HERNIA REPAIR Right 07/11/2016   Procedure: RIGHT INGUINAL HERNIA REPAIR;  Surgeon: Jackolyn Confer, MD;  [facility];  Service: General;  Laterality: Right;  Tap Block  . INSERTION OF MESH Right 07/11/2016   Procedure: INSERTION OF MESH;  Surgeon: Jackolyn Confer, MD;  [facility];  Service: General;  Laterality: Right;  Tap block  . ORCHIECTOMY Right 07/11/2016   Procedure: RIGHT RADICAL ORCHIECTOMY;  Surgeon: Franchot Gallo, MD;  Location[facility];  Service: Urology;  Laterality: Right;  . radiation seed pack  2016   for mole on back of right eye    FAMILY HISTORY:  Family History  Problem Relation Age of Onset  . Cancer Father        lung/smoker  . Cancer Paternal Grandfather        prostate    SOCIAL HISTORY:  Social History   Social History  . Marital status: Married    Spouse name: N/A  . Number of  children: N/A  . Years of education: N/A   Occupational History  . Not on file.   Social History Main Topics  . Smoking status: Never Smoker  . Smokeless tobacco: Never Used  . Alcohol use Yes     Comment:  15 cans beer/week  . Drug use: No  . Sexual activity: Yes   Other Topics Concern  . Not on file   Social History Narrative  . No narrative on file    ALLERGIES: Sulfa antibiotics and Sulfasalazine  MEDICATIONS:  Current Outpatient Prescriptions  Medication Sig Dispense Refill   . atorvastatin (LIPITOR) 20 MG tablet Take 20 mg by mouth every evening.    . cetirizine (ZYRTEC) 10 MG tablet Take 10 mg by mouth every evening.    . diphenhydramine-acetaminophen (TYLENOL PM) 25-500 MG TABS tablet Take 1-2 tablets by mouth at bedtime as needed (for pain/sleep).    . famotidine (PEPCID) 20 MG tablet Take 20 mg by mouth daily as needed for heartburn or indigestion.    . fluticasone (FLONASE) 50 MCG/ACT nasal spray Place 1-2 sprays into both nostrils daily as needed for allergies.     No current facility-administered medications for this encounter.     REVIEW OF SYSTEMS:  On review of systems, the patient reports that he is doing well overall. He has noted some numbness in the right inguinal region since his surgery but this is not worsening or radiating into the RLE.  He denies any chest pain, shortness of breath, cough, fevers, chills, night sweats, unintended weight changes. He denies any bowel or bladder disturbances, and denies abdominal pain, nausea or vomiting. He denies any new musculoskeletal or joint aches or pains. A complete review of systems is obtained and is otherwise negative.    PHYSICAL EXAM:  Wt Readings from Last 3 Encounters:  10/23/16 163 lb 2 oz (74 kg)  08/07/16 160 lb 6.4 oz (72.8 kg)  07/11/16 162 lb (73.5 kg)   Temp Readings from Last 3 Encounters:  10/23/16 97.9 F (36.6 C) (Oral)  08/07/16 98 F (36.7 C) (Oral)  07/11/16 97.8 F (36.6 C) (Oral)   BP Readings from Last 3 Encounters:  10/23/16 127/88  08/07/16 129/88  07/11/16 (!) 147/91   Pulse Readings from Last 3 Encounters:  10/23/16 77  08/07/16 78  07/11/16 97   Pain Assessment Pain Score: 0-No pain/10  In general this is a well appearing caucasian male in no acute distress. He's alert and oriented x4 and appropriate throughout the examination. Cardiopulmonary assessment is negative for acute distress and he exhibits normal effort. Abdomen is soft and non-tender to palpation.   Skin is warm, dry and intact without obvious lesions. Lower extremities are negative for pretibial pitting edema, deep calf tenderness, cyanosis or clubbing.   KPS = 100  100 - Normal; no complaints; no evidence of disease. 90   - Able to carry on normal activity; minor signs or symptoms of disease. 80   - Normal activity with effort; some signs or symptoms of disease. 60   - Cares for self; unable to carry on normal activity or to do active work. 60   - Requires occasional assistance, but is able to care for most of his personal needs. 50   - Requires considerable assistance and frequent medical care. 35   - Disabled; requires special care and assistance. 58   - Severely disabled; hospital admission is indicated although death not imminent. 9   - Very sick; hospital admission necessary;  active supportive treatment necessary. 10   - Moribund; fatal processes progressing rapidly. 0     - Dead  Karnofsky DA, Abelmann [place], Craver LS and Burchenal [facility] [phone]) The use of the nitrogen mustards in the palliative treatment of carcinoma: with particular reference to bronchogenic carcinoma Cancer 1 634-56  LABORATORY DATA:  Lab Results  Component Value Date   WBC 7.7 07/09/2016   HGB 14.4 07/09/2016   HCT 42.4 07/09/2016   MCV 87.6 07/09/2016   PLT 395 07/09/2016   Lab Results  Component Value Date   NA 136 07/09/2016   K 4.5 07/09/2016   CL 103 07/09/2016   CO2 27 07/09/2016   No results found for: ALT, AST, GGT, ALKPHOS, BILITOT   RADIOGRAPHY: No results found.    IMPRESSION/PLAN: 1. 51 y.o. gentleman with a Stage 1B, pT2NxM0S0 right testicular seminoma  The patient's tumor was confined in the tunica albuginea and his epdidymis, spermatic cord, and spermatic cord margin were free of tumor. However, there was focal lymphovascular space involvement by tumor. The patient has a variety of treatment options for the management of his disease including observation with surveillance imaging by  his PCP/Urology, completing a single round of chemotherapy or external beam radiation to the periaortic nodes to significantly lower his risk of disease recurrence.  Today, I reveiwed the findings and work-up thus far.  We reviewed the natural history of testicular seminoma and general treatment, highlighting the role of radiotherapy in the management.  We discussed the available radiation techniques, and focused on the details of logistics and delivery.  We would anticipate a 2 week course of EBRT in 10 fractions for a total of 20 Gy.  We reviewed the anticipated acute and late sequelae associated with radiation in this setting.  The patient was encouraged to ask questions that we answered to the best of my ability.  At the end of our conversation today, the patient elects to proceed with radiation therapy. He freely signed a written consent to proceed with planning and treatment and a copy of this document was placed in his file. He is scheduled for CT sim on 11/04/16 at 8 am.    Nicholos Johns, PA-C   This document serves as a record of services personally performed by [employer], PA-C. It was created on their behalf by Linward Natal, a trained medical scribe. The creation of this record is based on the scribe's personal observations and the provider's statements to them. This document has been checked and approved by the attending provider.

## 2016-10-23 NOTE — Addendum Note (Signed)
Encounter addended by: Sherrlyn Hock, LPN on: 3/81/7711  6:57 PM<BR>    Actions taken: Charge Capture section accepted

## 2016-10-24 ENCOUNTER — Encounter: Payer: Self-pay | Admitting: Urology

## 2016-11-01 ENCOUNTER — Encounter: Payer: Self-pay | Admitting: *Deleted

## 2016-11-03 NOTE — Progress Notes (Signed)
  Radiation Oncology         [phone] ________________________________  Name: Ray Roberts MRN: [id number]  Date: 11/04/2016  DOB: Apr 17, 1965  SIMULATION AND TREATMENT PLANNING NOTE    ICD-10-CM   1. Testicular seminoma, right (HCC) C62.91     DIAGNOSIS:  51 y.o. gentleman with a Stage 1B, pT2 N0 M0 S0 right testicular seminoma  NARRATIVE:  The patient was brought to the [facility].  Identity was confirmed.  All relevant records and images related to the planned course of therapy were reviewed.  The patient freely provided informed written consent to proceed with treatment after reviewing the details related to the planned course of therapy. The consent form was witnessed and verified by the simulation staff.  Then, the patient was set-up in a stable reproducible  supine position for radiation therapy.  CT images were obtained.  Surface markings were placed.  The CT images were loaded into the planning software.  Then the target and avoidance structures were contoured.  Treatment planning then occurred.  The radiation prescription was entered and confirmed.  Then, I designed and supervised the construction of a total of 3 medically necessary complex treatment devices with BodyFix and 2 MLCs.  I have requested : 3D Simulation  I have requested a DVH of the following structures: Lt Kidney, Rt Kidney, spinal cord, and targets.   PLAN:  The patient will receive 20 Gy in 10 fractions.  ________________________________  Sheral Apley Tammi Klippel, M.D.

## 2016-11-04 ENCOUNTER — Ambulatory Visit
Admission: RE | Admit: 2016-11-04 | Discharge: 2016-11-04 | Disposition: A | Discharge status: Home / Self Care | Payer: 59 | Source: Ambulatory Visit | Attending: Radiation Oncology | Admitting: Radiation Oncology

## 2016-11-04 DIAGNOSIS — C6291 Malignant neoplasm of right testis, unspecified whether descended or undescended: Secondary | ICD-10-CM | POA: Diagnosis not present

## 2016-11-04 DIAGNOSIS — Z923 Personal history of irradiation: Secondary | ICD-10-CM | POA: Diagnosis not present

## 2016-11-04 DIAGNOSIS — Z85820 Personal history of malignant melanoma of skin: Secondary | ICD-10-CM | POA: Diagnosis not present

## 2016-11-04 DIAGNOSIS — C6211 Malignant neoplasm of descended right testis: Secondary | ICD-10-CM | POA: Diagnosis not present

## 2016-11-06 DIAGNOSIS — C6211 Malignant neoplasm of descended right testis: Secondary | ICD-10-CM | POA: Diagnosis not present

## 2016-11-06 DIAGNOSIS — Z882 Allergy status to sulfonamides status: Secondary | ICD-10-CM | POA: Diagnosis not present

## 2016-11-06 DIAGNOSIS — C6291 Malignant neoplasm of right testis, unspecified whether descended or undescended: Secondary | ICD-10-CM | POA: Diagnosis not present

## 2016-11-06 DIAGNOSIS — Z79899 Other long term (current) drug therapy: Secondary | ICD-10-CM | POA: Diagnosis not present

## 2016-11-06 DIAGNOSIS — Z510 Encounter for antineoplastic radiation therapy: Secondary | ICD-10-CM | POA: Insufficient documentation

## 2016-11-07 DIAGNOSIS — T66XXXD Radiation sickness, unspecified, subsequent encounter: Secondary | ICD-10-CM | POA: Diagnosis not present

## 2016-11-07 DIAGNOSIS — H3589 Other specified retinal disorders: Secondary | ICD-10-CM | POA: Diagnosis not present

## 2016-11-11 ENCOUNTER — Ambulatory Visit
Admission: RE | Admit: 2016-11-11 | Discharge: 2016-11-11 | Disposition: A | Discharge status: Home / Self Care | Payer: 59 | Source: Ambulatory Visit | Attending: Radiation Oncology | Admitting: Radiation Oncology

## 2016-11-11 DIAGNOSIS — Z510 Encounter for antineoplastic radiation therapy: Secondary | ICD-10-CM | POA: Diagnosis not present

## 2016-11-11 DIAGNOSIS — C6211 Malignant neoplasm of descended right testis: Secondary | ICD-10-CM | POA: Diagnosis not present

## 2016-11-12 ENCOUNTER — Ambulatory Visit
Admission: RE | Admit: 2016-11-12 | Discharge: 2016-11-12 | Disposition: A | Discharge status: Home / Self Care | Payer: 59 | Source: Ambulatory Visit | Attending: Radiation Oncology | Admitting: Radiation Oncology

## 2016-11-12 DIAGNOSIS — Z510 Encounter for antineoplastic radiation therapy: Secondary | ICD-10-CM | POA: Diagnosis not present

## 2016-11-12 DIAGNOSIS — C6211 Malignant neoplasm of descended right testis: Secondary | ICD-10-CM | POA: Diagnosis not present

## 2016-11-13 ENCOUNTER — Ambulatory Visit
Admission: RE | Admit: 2016-11-13 | Discharge: 2016-11-13 | Disposition: A | Discharge status: Home / Self Care | Payer: 59 | Source: Ambulatory Visit | Attending: Radiation Oncology | Admitting: Radiation Oncology

## 2016-11-13 DIAGNOSIS — Z510 Encounter for antineoplastic radiation therapy: Secondary | ICD-10-CM | POA: Diagnosis not present

## 2016-11-13 DIAGNOSIS — C6211 Malignant neoplasm of descended right testis: Secondary | ICD-10-CM | POA: Diagnosis not present

## 2016-11-14 ENCOUNTER — Ambulatory Visit
Admission: RE | Admit: 2016-11-14 | Discharge: 2016-11-14 | Disposition: A | Discharge status: Home / Self Care | Payer: 59 | Source: Ambulatory Visit | Attending: Radiation Oncology | Admitting: Radiation Oncology

## 2016-11-14 DIAGNOSIS — C6211 Malignant neoplasm of descended right testis: Secondary | ICD-10-CM | POA: Diagnosis not present

## 2016-11-14 DIAGNOSIS — Z510 Encounter for antineoplastic radiation therapy: Secondary | ICD-10-CM | POA: Diagnosis not present

## 2016-11-15 ENCOUNTER — Ambulatory Visit
Admission: RE | Admit: 2016-11-15 | Discharge: 2016-11-15 | Disposition: A | Discharge status: Home / Self Care | Payer: 59 | Source: Ambulatory Visit | Attending: Radiation Oncology | Admitting: Radiation Oncology

## 2016-11-15 DIAGNOSIS — Z510 Encounter for antineoplastic radiation therapy: Secondary | ICD-10-CM | POA: Diagnosis not present

## 2016-11-15 DIAGNOSIS — C6211 Malignant neoplasm of descended right testis: Secondary | ICD-10-CM | POA: Diagnosis not present

## 2016-11-18 ENCOUNTER — Ambulatory Visit
Admission: RE | Admit: 2016-11-18 | Discharge: 2016-11-18 | Disposition: A | Discharge status: Home / Self Care | Payer: 59 | Source: Ambulatory Visit | Attending: Radiation Oncology | Admitting: Radiation Oncology

## 2016-11-18 DIAGNOSIS — C6211 Malignant neoplasm of descended right testis: Secondary | ICD-10-CM | POA: Diagnosis not present

## 2016-11-18 DIAGNOSIS — Z510 Encounter for antineoplastic radiation therapy: Secondary | ICD-10-CM | POA: Diagnosis not present

## 2016-11-19 ENCOUNTER — Ambulatory Visit
Admission: RE | Admit: 2016-11-19 | Discharge: 2016-11-19 | Disposition: A | Discharge status: Home / Self Care | Payer: 59 | Source: Ambulatory Visit | Attending: Radiation Oncology | Admitting: Radiation Oncology

## 2016-11-19 DIAGNOSIS — C6211 Malignant neoplasm of descended right testis: Secondary | ICD-10-CM | POA: Diagnosis not present

## 2016-11-19 DIAGNOSIS — Z510 Encounter for antineoplastic radiation therapy: Secondary | ICD-10-CM | POA: Diagnosis not present

## 2016-11-20 ENCOUNTER — Ambulatory Visit
Admission: RE | Admit: 2016-11-20 | Discharge: 2016-11-20 | Disposition: A | Discharge status: Home / Self Care | Payer: 59 | Source: Ambulatory Visit | Attending: Radiation Oncology | Admitting: Radiation Oncology

## 2016-11-20 DIAGNOSIS — Z510 Encounter for antineoplastic radiation therapy: Secondary | ICD-10-CM | POA: Diagnosis not present

## 2016-11-20 DIAGNOSIS — C6211 Malignant neoplasm of descended right testis: Secondary | ICD-10-CM | POA: Diagnosis not present

## 2016-11-21 ENCOUNTER — Ambulatory Visit
Admission: RE | Admit: 2016-11-21 | Discharge: 2016-11-21 | Disposition: A | Discharge status: Home / Self Care | Payer: 59 | Source: Ambulatory Visit | Attending: Radiation Oncology | Admitting: Radiation Oncology

## 2016-11-21 DIAGNOSIS — C6211 Malignant neoplasm of descended right testis: Secondary | ICD-10-CM | POA: Diagnosis not present

## 2016-11-21 DIAGNOSIS — Z510 Encounter for antineoplastic radiation therapy: Secondary | ICD-10-CM | POA: Diagnosis not present

## 2016-11-22 ENCOUNTER — Ambulatory Visit
Admission: RE | Admit: 2016-11-22 | Discharge: 2016-11-22 | Disposition: A | Discharge status: Home / Self Care | Payer: 59 | Source: Ambulatory Visit | Attending: Radiation Oncology | Admitting: Radiation Oncology

## 2016-11-22 DIAGNOSIS — C6211 Malignant neoplasm of descended right testis: Secondary | ICD-10-CM | POA: Diagnosis not present

## 2016-11-22 DIAGNOSIS — Z510 Encounter for antineoplastic radiation therapy: Secondary | ICD-10-CM | POA: Diagnosis not present

## 2016-11-25 ENCOUNTER — Ambulatory Visit
Admission: RE | Admit: 2016-11-25 | Discharge: 2016-11-25 | Disposition: A | Discharge status: Home / Self Care | Payer: 59 | Source: Ambulatory Visit | Attending: Radiation Oncology | Admitting: Radiation Oncology

## 2016-11-25 ENCOUNTER — Encounter: Payer: Self-pay | Admitting: Radiation Oncology

## 2016-11-25 DIAGNOSIS — C6211 Malignant neoplasm of descended right testis: Secondary | ICD-10-CM | POA: Diagnosis not present

## 2016-11-25 DIAGNOSIS — Z510 Encounter for antineoplastic radiation therapy: Secondary | ICD-10-CM | POA: Diagnosis not present

## 2016-11-28 NOTE — Progress Notes (Signed)
  Radiation Oncology         [phone]) [phone] ________________________________  Name: Ray Roberts MRN: [id number]  Date: 11/25/2016  DOB: 1966/02/08  End of Treatment Note  Diagnosis:   51 y.o. 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  Beams/energy:   AP/PA 3D plan // 15X  Narrative: The patient tolerated radiation treatment relatively well.   He denies pain or fatigue. He denies hematuria, incontinence, or weak stream. He denies diarrhea or skin irritation.   Plan: The patient has completed radiation treatment. The patient will return to radiation oncology clinic for routine followup in one month. I advised him to call or return sooner if he has any questions or concerns related to his recovery or treatment. ________________________________  Sheral Apley. Tammi Klippel, M.D.   This document serves as a record of services personally performed by Tyler Pita, MD. It was created on his behalf by Arlyce Harman, a trained medical scribe. The creation of this record is based on the scribe's personal observations and the provider's statements to them. This document has been checked and approved by the attending provider.

## 2016-12-19 DIAGNOSIS — T66XXXD Radiation sickness, unspecified, subsequent encounter: Secondary | ICD-10-CM | POA: Diagnosis not present

## 2016-12-19 DIAGNOSIS — H3589 Other specified retinal disorders: Secondary | ICD-10-CM | POA: Diagnosis not present

## 2017-01-02 ENCOUNTER — Encounter: Payer: Self-pay | Admitting: Urology

## 2017-01-02 ENCOUNTER — Ambulatory Visit
Admission: RE | Admit: 2017-01-02 | Discharge: 2017-01-02 | Disposition: A | Discharge status: Home / Self Care | Payer: 59 | Source: Ambulatory Visit | Attending: Urology | Admitting: Urology

## 2017-01-02 ENCOUNTER — Telehealth: Payer: Self-pay | Admitting: *Deleted

## 2017-01-02 VITALS — BP 124/79 | HR 79 | Temp 98.5°F | Resp 20 | Ht 67.0 in | Wt 161.0 lb

## 2017-01-02 DIAGNOSIS — Z882 Allergy status to sulfonamides status: Secondary | ICD-10-CM | POA: Diagnosis not present

## 2017-01-02 DIAGNOSIS — C6291 Malignant neoplasm of right testis, unspecified whether descended or undescended: Secondary | ICD-10-CM

## 2017-01-02 DIAGNOSIS — Z510 Encounter for antineoplastic radiation therapy: Secondary | ICD-10-CM | POA: Diagnosis not present

## 2017-01-02 NOTE — Progress Notes (Signed)
Radiation Oncology         [phone] ________________________________  Name: Ray Roberts MRN: [id number]  Date: 01/02/2017  DOB: 08/27/1965  Post Treatment Note  CC: Crist Infante, MD  Franchot Gallo, MD  Diagnosis:  51 y.o. 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  Narrative:  The patient returns today for routine follow-up. He tolerated radiation treatment relatively well.  Throughout treatment, he denied pain or fatigue, hematuria, incontinence, weak stream, diarrhea or skin irritation.                              On review of systems, the patient states that he is doing very well overall and is currently without complaints.  He denies recent fever, chills, unintentional weight loss, night sweats, gross hematuria, dysuria, urinary frequency, urgency or incontinence.  He has a good appetite and is maintaining his weight.  He denies abdominal pain, N/V or diarrhea.  He has not detected any new scrotal or testicular masses and denies any pain or discomfort in the groin or testicles.  His energy level is gradually returning.  ALLERGIES:  is allergic to sulfa antibiotics and sulfasalazine.  Meds: Current Outpatient Prescriptions  Medication Sig Dispense Refill  . atorvastatin (LIPITOR) 20 MG tablet Take 20 mg by mouth every evening.    . cetirizine (ZYRTEC) 10 MG tablet Take 10 mg by mouth every evening.    . diphenhydramine-acetaminophen (TYLENOL PM) 25-500 MG TABS tablet Take 1-2 tablets by mouth at bedtime as needed (for pain/sleep).    . famotidine (PEPCID) 20 MG tablet Take 20 mg by mouth daily as needed for heartburn or indigestion.    . fluticasone (FLONASE) 50 MCG/ACT nasal spray Place 1-2 sprays into both nostrils daily as needed for allergies.     No current facility-administered medications for this  encounter.     Physical Findings:  height is 5\' 7"  (1.702 m) and weight is 161 lb (73 kg). His oral temperature is 98.5 F (36.9 C). His blood pressure is 124/79 and his pulse is 79. His respiration is 20 and oxygen saturation is 98%.  Pain Assessment Pain Score: 0-No pain/10 In general this is a well appearing caucasian male in no acute distress. He's alert and oriented x4 and appropriate throughout the examination. Cardiopulmonary assessment is negative for acute distress and he exhibits normal effort.   Lab Findings: Lab Results  Component Value Date   WBC 7.7 07/09/2016   HGB 14.4 07/09/2016   HCT 42.4 07/09/2016   MCV 87.6 07/09/2016   PLT 395 07/09/2016     Radiographic Findings: No results found.  Impression/Plan: 1. 51 y.o. 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 He appears to have recovered well from the effects of radiotherapy.  Today, Dr. Tammi Klippel and I discussed disease surveillance going forward.  In accordance with the NCCN guidelines for surveillance of Stage I seminomas after adjuvant radiotherapy, we will plan to obtain a baseline post-treatment CT A/P in approximately 2 months and follow up to review results at that time. Pending no concerning findings on this scan, we will plan to continue to follow with serial CT A/P scans annually thereafter. He will continue in routine follow up with  Dr. Diona Fanti annually and will call for an appointment around June 2019.  He knows to call at any time with questions or concerns related to his previous radiotherapy.  He is comfortable and in agreement with this plan.     Nicholos Johns, PA-C    Tyler Pita, MD  [facility] Direct Dial: [phone]  Fax: [phone] [email or web address]  Skype  LinkedIn    Page Me

## 2017-01-02 NOTE — Telephone Encounter (Signed)
CALLED [facility] TO ARRANGE FU WITH DR. [facility] FOR June  2019 FOR THIS PATIENT, SPOKE WITH SCHEDULER AND SHE TOLD ME THAT THE SCHEDULE FOR NEXT YEAR WOULD NOT BE OPEN UNTIL NOV. 2018

## 2017-02-06 DIAGNOSIS — T66XXXD Radiation sickness, unspecified, subsequent encounter: Secondary | ICD-10-CM | POA: Diagnosis not present

## 2017-02-06 DIAGNOSIS — H3589 Other specified retinal disorders: Secondary | ICD-10-CM | POA: Diagnosis not present

## 2017-03-03 ENCOUNTER — Telehealth: Payer: Self-pay | Admitting: *Deleted

## 2017-03-03 NOTE — Telephone Encounter (Signed)
CALLED PATIENT TO CANCEL FU VISIT DUE TO PATIENT NOT HAVING CT, SPOKE WITH PATIENT AND WILL GET CT AND FU RESCHEDULED, PATIENT VERIFIED UNDERSTANDING THIS

## 2017-03-04 ENCOUNTER — Telehealth: Payer: Self-pay | Admitting: *Deleted

## 2017-03-04 ENCOUNTER — Ambulatory Visit
Admission: RE | Admit: 2017-03-04 | Discharge: 2017-03-04 | Disposition: A | Discharge status: Home / Self Care | Payer: 59 | Source: Ambulatory Visit | Attending: Radiation Oncology | Admitting: Radiation Oncology

## 2017-03-04 DIAGNOSIS — Z510 Encounter for antineoplastic radiation therapy: Secondary | ICD-10-CM | POA: Insufficient documentation

## 2017-03-04 DIAGNOSIS — Z882 Allergy status to sulfonamides status: Secondary | ICD-10-CM | POA: Insufficient documentation

## 2017-03-04 DIAGNOSIS — Z79899 Other long term (current) drug therapy: Secondary | ICD-10-CM | POA: Insufficient documentation

## 2017-03-04 DIAGNOSIS — C6291 Malignant neoplasm of right testis, unspecified whether descended or undescended: Secondary | ICD-10-CM | POA: Insufficient documentation

## 2017-03-04 NOTE — Telephone Encounter (Signed)
CALLED PATIENT TO INFORM OF LABS ON 03-11-17 @ 1:15 PM @ [facility] AND HIS CT ON 03-11-17 - ARRIVAL TIME - 2:15 PM, PT. TO BE NPO- 4 HRS. PRIOR TO TEST, PT. TO PICK-UP DRINK @ RADIOLOGY ON 03-07-17, PT. TO COME IN FOR FU FOR RESULTS ON 03-12-17 @ 3:00 PM WITH ASHLYN BRUNING, SPOKE WITH PATIENT AND HE IS AWARE OF THESE APPTS.

## 2017-03-06 NOTE — Progress Notes (Signed)
Ray Roberts 51 y.o. man with stage 1B, pT2 N0 M0 S0 right testicular seminoma completed radiation 11-25-16,review CT abdomen pelvis w wo contrast,FU.  Pain:No Fatigue:Denies having fatigue. Bladder issues:Denies having dysuria,hematuria,nocturia, frequency/urgency/strength of stream, incontinence or incomplete empty of his bladder. Nocturia x 1. Diarrhea:No, having normal movements daily. Skin irritation:Normal skin Weight: Wt Readings from Last 3 Encounters:  03/12/17 163 lb 3.2 oz (74 kg)  01/02/17 161 lb (73 kg)  10/23/16 163 lb 2 oz (74 kg)  BP 123/88 (BP Location: Left Arm, Patient Position: Sitting, Cuff Size: Normal)   Pulse 85   Temp 97.8 F (36.6 C) (Oral)   Resp 20   Ht 5\' 7"  (1.702 m)   Wt 163 lb 3.2 oz (74 kg)   BMI 25.56 kg/m

## 2017-03-11 ENCOUNTER — Ambulatory Visit
Admission: RE | Admit: 2017-03-11 | Discharge: 2017-03-11 | Disposition: A | Discharge status: Home / Self Care | Payer: 59 | Source: Ambulatory Visit | Attending: Urology | Admitting: Urology

## 2017-03-11 ENCOUNTER — Encounter (HOSPITAL_COMMUNITY): Payer: Self-pay | Admitting: Radiology

## 2017-03-11 ENCOUNTER — Ambulatory Visit (HOSPITAL_COMMUNITY)
Admission: RE | Admit: 2017-03-11 | Discharge: 2017-03-11 | Disposition: A | Discharge status: Home / Self Care | Payer: 59 | Source: Ambulatory Visit | Attending: Urology | Admitting: Urology

## 2017-03-11 ENCOUNTER — Other Ambulatory Visit: Payer: Self-pay | Admitting: Urology

## 2017-03-11 ENCOUNTER — Other Ambulatory Visit: Payer: Self-pay | Admitting: *Deleted

## 2017-03-11 DIAGNOSIS — C6291 Malignant neoplasm of right testis, unspecified whether descended or undescended: Secondary | ICD-10-CM

## 2017-03-11 DIAGNOSIS — Z9079 Acquired absence of other genital organ(s): Secondary | ICD-10-CM | POA: Diagnosis not present

## 2017-03-11 DIAGNOSIS — Z8547 Personal history of malignant neoplasm of testis: Secondary | ICD-10-CM | POA: Diagnosis not present

## 2017-03-11 DIAGNOSIS — Z882 Allergy status to sulfonamides status: Secondary | ICD-10-CM | POA: Diagnosis not present

## 2017-03-11 DIAGNOSIS — Z510 Encounter for antineoplastic radiation therapy: Secondary | ICD-10-CM | POA: Diagnosis present

## 2017-03-11 DIAGNOSIS — K5730 Diverticulosis of large intestine without perforation or abscess without bleeding: Secondary | ICD-10-CM | POA: Diagnosis not present

## 2017-03-11 DIAGNOSIS — Z79899 Other long term (current) drug therapy: Secondary | ICD-10-CM | POA: Diagnosis not present

## 2017-03-11 LAB — COMPREHENSIVE METABOLIC PANEL
ALBUMIN: 4.3 g/dL (ref 3.5–5.0)
ALK PHOS: 54 U/L (ref 40–150)
ALT: 26 U/L (ref 0–55)
AST: 21 U/L (ref 5–34)
Anion Gap: 13 mEq/L — ABNORMAL HIGH (ref 3–11)
BILIRUBIN TOTAL: 0.61 mg/dL (ref 0.20–1.20)
BUN: 13.5 mg/dL (ref 7.0–26.0)
CO2: 24 mEq/L (ref 22–29)
CREATININE: 1 mg/dL (ref 0.7–1.3)
Calcium: 9.7 mg/dL (ref 8.4–10.4)
Chloride: 102 mEq/L (ref 98–109)
GLUCOSE: 97 mg/dL (ref 70–140)
Potassium: 3.8 mEq/L (ref 3.5–5.1)
SODIUM: 139 meq/L (ref 136–145)
TOTAL PROTEIN: 8.2 g/dL (ref 6.4–8.3)

## 2017-03-11 MED ORDER — IOPAMIDOL (ISOVUE-300) INJECTION 61%
INTRAVENOUS | Status: AC
  Administered 2017-03-11: 100 mL via INTRAVENOUS
  Filled 2017-03-11: qty 100

## 2017-03-11 MED ORDER — IOPAMIDOL (ISOVUE-300) INJECTION 61%
100.0000 mL | Freq: Once | INTRAVENOUS | Status: AC | PRN
  Administered 2017-03-11: 100 mL via INTRAVENOUS

## 2017-03-12 ENCOUNTER — Other Ambulatory Visit: Payer: Self-pay

## 2017-03-12 ENCOUNTER — Encounter: Payer: Self-pay | Admitting: Urology

## 2017-03-12 ENCOUNTER — Ambulatory Visit
Admission: RE | Admit: 2017-03-12 | Discharge: 2017-03-12 | Disposition: A | Discharge status: Home / Self Care | Payer: 59 | Source: Ambulatory Visit | Attending: Urology | Admitting: Urology

## 2017-03-12 VITALS — BP 123/88 | HR 85 | Temp 97.8°F | Resp 20 | Ht 67.0 in | Wt 163.2 lb

## 2017-03-12 DIAGNOSIS — Z85840 Personal history of malignant neoplasm of eye: Secondary | ICD-10-CM | POA: Diagnosis not present

## 2017-03-12 DIAGNOSIS — Z510 Encounter for antineoplastic radiation therapy: Secondary | ICD-10-CM | POA: Diagnosis not present

## 2017-03-12 DIAGNOSIS — C6291 Malignant neoplasm of right testis, unspecified whether descended or undescended: Secondary | ICD-10-CM

## 2017-03-12 DIAGNOSIS — Z08 Encounter for follow-up examination after completed treatment for malignant neoplasm: Secondary | ICD-10-CM | POA: Diagnosis not present

## 2017-03-12 NOTE — Progress Notes (Signed)
Radiation Oncology         [phone] ________________________________  Name: Ray Roberts MRN: [id number]  Date: 03/12/2017  DOB: [birth year]  Post Treatment Note  CC: Crist Infante, MD  Franchot Gallo, MD  Diagnosis:  51 y.o. 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      Interval Since Last Radiation:  3.5 months  11/12/2016 to 11/25/2016: The pelvic paraaortic nodes were treated to 20 Gy in 10 fractions of 2 Gy.  06/27/2014: Radioactive Plaque Application to right eye for malignant melanoma of right choroid 70 cGy/hr 125 Iodine Sources at [facility]; Dr. Gerarda Fraction  Narrative:  In brief summary, the patient was initially diagnosed with a Stage 1B, pT2NxM0S0 right testicular seminoma in April 2018 following a right orchiectomy for definitive diagnosis. Serum tumor markers (LDH, AFP and b-hCG) were negative.   CT A/P on 07/26/16 which did not demonstrate any pelvic or abdominal lymphadenopathy and no evidence of metastatic disease.  He completed adjuvant radiotherapy to the periaortic nodes 11/12/2016 to 11/25/2016.  Interval History:  The patient returns today for routine follow-up. He has recovered well from the effects of radiotherapy.  He presents today to discuss the results from his recent surveillance CT imaging performed 03/11/17.                            On review of systems, the patient states that he is doing very well overall and is currently without complaints.  He denies recent fever, chills, unintentional weight loss, night sweats, gross hematuria, dysuria, urinary frequency, urgency or incontinence.  He has a good appetite and is maintaining his weight.  He denies abdominal pain, N/V or diarrhea.  He has not detected any new scrotal or testicular masses and denies any pain or discomfort in the groin or left testicle.  His energy level has returned to normal.  ALLERGIES:  is allergic to sulfa  antibiotics and sulfasalazine.  Meds: Current Outpatient Medications  Medication Sig Dispense Refill  . atorvastatin (LIPITOR) 20 MG tablet Take 20 mg by mouth every evening.    . cetirizine (ZYRTEC) 10 MG tablet Take 10 mg by mouth every evening.    . famotidine (PEPCID) 20 MG tablet Take 20 mg by mouth daily as needed for heartburn or indigestion.    . diphenhydramine-acetaminophen (TYLENOL PM) 25-500 MG TABS tablet Take 1-2 tablets by mouth at bedtime as needed (for pain/sleep).    . fluticasone (FLONASE) 50 MCG/ACT nasal spray Place 1-2 sprays into both nostrils daily as needed for allergies.     No current facility-administered medications for this encounter.     Physical Findings:  height is 5\' 7"  (1.702 m) and weight is 163 lb 3.2 oz (74 kg). His oral temperature is 97.8 F (36.6 C). His blood pressure is 123/88 and his pulse is 85. His respiration is 20.  Pain Assessment Pain Score: 0-No pain/10 In general this is a well appearing caucasian male in no acute distress. He's alert and oriented x4 and appropriate throughout the examination. Cardiopulmonary assessment is negative for acute distress and he exhibits normal effort.   Lab Findings: Lab Results  Component Value Date   WBC 7.7 07/09/2016   HGB 14.4 07/09/2016   HCT 42.4 07/09/2016   MCV 87.6 07/09/2016   PLT 395 07/09/2016     Radiographic Findings: Ct Abdomen Pelvis W Contrast  Result Date:  03/11/2017 CLINICAL DATA:  Followup right testicular seminoma. Previous orchiectomy and radiation therapy. EXAM: CT ABDOMEN AND PELVIS WITH CONTRAST TECHNIQUE: Multidetector CT imaging of the abdomen and pelvis was performed using the standard protocol following bolus administration of intravenous contrast. CONTRAST:  100 mL Isovue-300 COMPARISON:  07/26/2016 FINDINGS: Lower Chest: No acute findings. Hepatobiliary: No hepatic masses identified. Gallbladder is unremarkable. Pancreas:  No mass or inflammatory changes. Spleen:  Within normal limits in size and appearance. Adrenals/Urinary Tract: No masses identified. No evidence of hydronephrosis. Stomach/Bowel: No evidence of obstruction, inflammatory process or abnormal fluid collections. Normal appendix visualized. Diffuse colonic diverticulosis again seen, without evidence of diverticulitis. Vascular/Lymphatic: No pathologically enlarged lymph nodes in the abdomen or pelvis. No abdominal aortic aneurysm. Aortic atherosclerosis. Reproductive: Prior right orchiectomy. No mass or other significant abnormality. Other:  None. Musculoskeletal:  No suspicious bone lesions identified. IMPRESSION: Prior right orchiectomy. No evidence abdominal or pelvic metastatic disease. Colonic diverticulosis, without radiographic evidence of diverticulitis or other acute findings. Electronically Signed   By: Earle Gell M.D.   On: 03/11/2017 16:50    Impression/Plan: 1. 51 y.o. 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 He appears to have recovered well from the effects of radiotherapy.  Recent post-treatment imaging suggests no evidence of disease in the abdomen or pelvis.  In accordance with the NCCN guidelines for surveillance of Stage I seminomas after adjuvant radiotherapy, we will plan to continue to follow with serial CT A/P scans annually and we will see him in the office to review results following each scan. He will continue in routine follow up with Dr. Diona Fanti annually as well and will call for an appointment around June 2019.  He knows to call our office at any time with questions or concerns related to his previous radiotherapy.  He is comfortable and in agreement with this plan.   2. History of  malignant melanoma of right choroid.  He continues in close follow up with Dr. Cordelia Pen in ophthalmology at [facility] and has shown a good response to treatment without evidence of disease progression.  Last visit was in 01/2017.  Routine follow up and disease surveillance  will continue under the care and direction of Dr. Cordelia Pen.    Nicholos Johns, PA-C

## 2017-03-12 NOTE — Addendum Note (Signed)
Encounter addended by: Malena Edman, RN on: 03/12/2017 4:34 PM  Actions taken: Charge Capture section accepted

## 2017-04-03 DIAGNOSIS — H3589 Other specified retinal disorders: Secondary | ICD-10-CM | POA: Diagnosis not present

## 2017-04-03 DIAGNOSIS — T66XXXD Radiation sickness, unspecified, subsequent encounter: Secondary | ICD-10-CM | POA: Diagnosis not present

## 2017-06-25 DIAGNOSIS — C6931 Malignant neoplasm of right choroid: Secondary | ICD-10-CM | POA: Diagnosis not present

## 2017-06-25 DIAGNOSIS — H3561 Retinal hemorrhage, right eye: Secondary | ICD-10-CM | POA: Diagnosis not present

## 2017-06-25 DIAGNOSIS — H3589 Other specified retinal disorders: Secondary | ICD-10-CM | POA: Diagnosis not present

## 2017-06-25 DIAGNOSIS — T66XXXD Radiation sickness, unspecified, subsequent encounter: Secondary | ICD-10-CM | POA: Diagnosis not present

## 2017-07-09 DIAGNOSIS — R82998 Other abnormal findings in urine: Secondary | ICD-10-CM | POA: Diagnosis not present

## 2017-07-09 DIAGNOSIS — Z0000 Encounter for general adult medical examination without abnormal findings: Secondary | ICD-10-CM | POA: Diagnosis not present

## 2017-07-31 DIAGNOSIS — H3581 Retinal edema: Secondary | ICD-10-CM | POA: Diagnosis not present

## 2017-07-31 DIAGNOSIS — T66XXXD Radiation sickness, unspecified, subsequent encounter: Secondary | ICD-10-CM | POA: Diagnosis not present

## 2017-08-19 DIAGNOSIS — H9312 Tinnitus, left ear: Secondary | ICD-10-CM | POA: Diagnosis not present

## 2017-08-19 DIAGNOSIS — H6122 Impacted cerumen, left ear: Secondary | ICD-10-CM | POA: Diagnosis not present

## 2017-08-19 DIAGNOSIS — Z7289 Other problems related to lifestyle: Secondary | ICD-10-CM | POA: Diagnosis not present

## 2017-08-26 ENCOUNTER — Encounter: Payer: Self-pay | Admitting: Internal Medicine

## 2017-08-26 DIAGNOSIS — Z0000 Encounter for general adult medical examination without abnormal findings: Secondary | ICD-10-CM | POA: Diagnosis not present

## 2017-08-26 DIAGNOSIS — K4090 Unilateral inguinal hernia, without obstruction or gangrene, not specified as recurrent: Secondary | ICD-10-CM | POA: Diagnosis not present

## 2017-08-26 DIAGNOSIS — R7301 Impaired fasting glucose: Secondary | ICD-10-CM | POA: Diagnosis not present

## 2017-08-26 DIAGNOSIS — Z1389 Encounter for screening for other disorder: Secondary | ICD-10-CM | POA: Diagnosis not present

## 2017-10-09 DIAGNOSIS — H3561 Retinal hemorrhage, right eye: Secondary | ICD-10-CM | POA: Diagnosis not present

## 2017-10-09 DIAGNOSIS — H35351 Cystoid macular degeneration, right eye: Secondary | ICD-10-CM | POA: Diagnosis not present

## 2017-10-09 DIAGNOSIS — H3589 Other specified retinal disorders: Secondary | ICD-10-CM | POA: Diagnosis not present

## 2017-10-21 ENCOUNTER — Ambulatory Visit (AMBULATORY_SURGERY_CENTER): Payer: Self-pay | Admitting: *Deleted

## 2017-10-21 VITALS — Ht 67.0 in | Wt 164.2 lb

## 2017-10-21 DIAGNOSIS — Z1211 Encounter for screening for malignant neoplasm of colon: Secondary | ICD-10-CM

## 2017-10-21 NOTE — Progress Notes (Signed)
No egg or soy allergy known to patient  No issues with past sedation with any surgeries  or procedures, no intubation problems  No diet pills per patient No home 02 use per patient  No blood thinners per patient  Pt denies issues with constipation  No A fib or A flutter  EMMI video offered, patient declined.

## 2017-10-23 ENCOUNTER — Encounter: Payer: Self-pay | Admitting: Internal Medicine

## 2017-11-04 ENCOUNTER — Encounter: Payer: Self-pay | Admitting: Internal Medicine

## 2017-11-04 ENCOUNTER — Ambulatory Visit (AMBULATORY_SURGERY_CENTER): Payer: 59 | Admitting: Internal Medicine

## 2017-11-04 VITALS — BP 119/82 | HR 81 | Temp 97.5°F | Resp 17 | Ht 67.0 in | Wt 158.0 lb

## 2017-11-04 DIAGNOSIS — D122 Benign neoplasm of ascending colon: Secondary | ICD-10-CM | POA: Diagnosis not present

## 2017-11-04 DIAGNOSIS — K635 Polyp of colon: Secondary | ICD-10-CM

## 2017-11-04 DIAGNOSIS — Z1211 Encounter for screening for malignant neoplasm of colon: Secondary | ICD-10-CM

## 2017-11-04 DIAGNOSIS — D120 Benign neoplasm of cecum: Secondary | ICD-10-CM

## 2017-11-04 DIAGNOSIS — D125 Benign neoplasm of sigmoid colon: Secondary | ICD-10-CM

## 2017-11-04 DIAGNOSIS — C6931 Malignant neoplasm of right choroid: Secondary | ICD-10-CM

## 2017-11-04 DIAGNOSIS — D128 Benign neoplasm of rectum: Secondary | ICD-10-CM

## 2017-11-04 MED ORDER — SODIUM CHLORIDE 0.9 % IV SOLN: 500.0000 mL | Freq: Once | INTRAVENOUS | Status: DC

## 2017-11-04 NOTE — Progress Notes (Signed)
Called to room to assist during endoscopic procedure.  Patient ID and intended procedure confirmed with present staff. Received instructions for my participation in the procedure from the performing physician.  

## 2017-11-04 NOTE — Op Note (Signed)
[facility] Patient Name: Ray Roberts Procedure Date: 11/04/2017 9:00 AM MRN: [id number] Endoscopist: Gatha Mayer , MD Age: 52 Referring MD:  Date of Birth: [birth year] Gender: Male Account #: [id number] Procedure:                Colonoscopy Indications:              Screening for colorectal malignant neoplasm, This                            is the patient's first colonoscopy Medicines:                Propofol per Anesthesia, Monitored Anesthesia Care Procedure:                Pre-Anesthesia Assessment:                           - Prior to the procedure, a History and Physical                            was performed, and patient medications and                            allergies were reviewed. The patient's tolerance of                            previous anesthesia was also reviewed. The risks                            and benefits of the procedure and the sedation                            options and risks were discussed with the patient.                            All questions were answered, and informed consent                            was obtained. Prior Anticoagulants: The patient has                            taken no previous anticoagulant or antiplatelet                            agents. ASA Grade Assessment: II - A patient with                            mild systemic disease. After reviewing the risks                            and benefits, the patient was deemed in                            satisfactory condition to undergo the procedure.  After obtaining informed consent, the colonoscope                            was passed under direct vision. Throughout the                            procedure, the patient's blood pressure, pulse, and                            oxygen saturations were monitored continuously. The                            Colonoscope was introduced through the anus and   advanced to the the cecum, identified by                            appendiceal orifice and ileocecal valve. The                            colonoscopy was somewhat difficult due to multiple                            diverticula in the colon and restricted mobility of                            the colon. Successful completion of the procedure                            was aided by withdrawing and reinserting the scope.                            The patient tolerated the procedure well. The                            quality of the bowel preparation was excellent. The                            ileocecal valve, appendiceal orifice, and rectum                            were photographed. The bowel preparation used was                            Miralax. Scope In: 9:08:05 AM Scope Out: 9:29:12 AM Scope Withdrawal Time: 0 hours 15 minutes 39 seconds  Total Procedure Duration: 0 hours 21 minutes 7 seconds  Findings:                 The perianal and digital rectal examinations were                            normal. Pertinent negatives include normal prostate                            (size, shape, and  consistency).                           Two sessile polyps were found in the sigmoid colon                            and ascending colon. The polyps were 3 to 6 mm in                            size. These polyps were removed with a cold snare.                            Resection and retrieval were complete. Verification                            of patient identification for the specimen was                            done. Estimated blood loss was minimal.                           Multiple diverticula were found in the sigmoid                            colon. There was narrowing of the colon in                            association with the diverticular opening.                           The exam was otherwise without abnormality on                            direct and retroflexion  views. Complications:            No immediate complications. Estimated Blood Loss:     Estimated blood loss was minimal. Impression:               - Two 3 to 6 mm polyps in the sigmoid colon and in                            the ascending colon, removed with a cold snare.                            Resected and retrieved.                           - Severe diverticulosis in the sigmoid colon. There                            was narrowing of the colon in association with the                            diverticular opening.                           -  The examination was otherwise normal on direct                            and retroflexion views. Recommendation:           - Patient has a contact number available for                            emergencies. The signs and symptoms of potential                            delayed complications were discussed with the                            patient. Return to normal activities tomorrow.                            Written discharge instructions were provided to the                            patient.                           - Resume previous diet.                           - Continue present medications.                           - Repeat colonoscopy is recommended. The                            colonoscopy date will be determined after pathology                            results from today's exam become available for                            review.                           - Now that he has had a colonoscopy annual                            hemoccults not needed. Gatha Mayer, MD 11/04/2017 9:38:51 AM This report has been signed electronically.

## 2017-11-04 NOTE — Progress Notes (Signed)
Spontaneous respirations throughout. VSS. Resting comfortably. To PACU on room air. Report to  RN. 

## 2017-11-04 NOTE — Progress Notes (Signed)
Pt's states no medical or surgical changes since previsit or office visit. 

## 2017-11-04 NOTE — Patient Instructions (Addendum)
I found and removed 2 small polyps that look benign.  You also have diverticulosis - thickened muscle rings and pouches in the colon wall. Please read the handout about this condition.  I will let you know pathology results and when to have another routine colonoscopy by mail and/or My Chart.  Routine testing of the stool will not be necessary - you may decline that if offered  I appreciate the opportunity to care for you. Gatha Mayer, MD, FACGYOU HAD AN ENDOSCOPIC PROCEDURE TODAY AT [facility]:   Refer to the procedure report that was given to you for any specific questions about what was found during the examination.  If the procedure report does not answer your questions, please call your gastroenterologist to clarify.  If you requested that your care partner not be given the details of your procedure findings, then the procedure report has been included in a sealed envelope for you to review at your convenience later.  YOU SHOULD EXPECT: Some feelings of bloating in the abdomen. Passage of more gas than usual.  Walking can help get rid of the air that was put into your GI tract during the procedure and reduce the bloating. If you had a lower endoscopy (such as a colonoscopy or flexible sigmoidoscopy) you may notice spotting of blood in your stool or on the toilet paper. If you underwent a bowel prep for your procedure, you may not have a normal bowel movement for a few days.  Please Note:  You might notice some irritation and congestion in your nose or some drainage.  This is from the oxygen used during your procedure.  There is no need for concern and it should clear up in a day or so.  SYMPTOMS TO REPORT IMMEDIATELY:   Following lower endoscopy (colonoscopy or flexible sigmoidoscopy):  Excessive amounts of blood in the stool  Significant tenderness or worsening of abdominal pains  Swelling of the abdomen that is new, acute  Fever of 100F or  higher   Following upper endoscopy (EGD)  Vomiting of blood or coffee ground material  New chest pain or pain under the shoulder blades  Painful or persistently difficult swallowing  New shortness of breath  Fever of 100F or higher  Black, tarry-looking stools  For urgent or emergent issues, a gastroenterologist can be reached at any hour by calling [phone].   DIET:  We do recommend a small meal at first, but then you may proceed to your regular diet.  Drink plenty of fluids but you should avoid alcoholic beverages for 24 hours.  ACTIVITY:  You should plan to take it easy for the rest of today and you should NOT DRIVE or use heavy machinery until tomorrow (because of the sedation medicines used during the test).    FOLLOW UP: Our staff will call the number listed on your records the next business day following your procedure to check on you and address any questions or concerns that you may have regarding the information given to you following your procedure. If we do not reach you, we will leave a message.  However, if you are feeling well and you are not experiencing any problems, there is no need to return our call.  We will assume that you have returned to your regular daily activities without incident.  If any biopsies were taken you will be contacted by phone or by letter within the next 1-3 weeks.  Please call us at [phone]  if you have not heard about the biopsies in 3 weeks.    SIGNATURES/CONFIDENTIALITY: You and/or your care partner have signed paperwork which will be entered into your electronic medical record.  These signatures attest to the fact that that the information above on your After Visit Summary has been reviewed and is understood.  Full responsibility of the confidentiality of this discharge information lies with you and/or your care-partner.  Polyp and diverticulosis information given.

## 2017-11-05 ENCOUNTER — Telehealth: Payer: Self-pay | Admitting: *Deleted

## 2017-11-05 NOTE — Telephone Encounter (Signed)
  Follow up Call-  Call back number 11/04/2017  Post procedure Call Back phone  # [phone]  Permission to leave phone message Yes  Some recent data might be hidden     Patient questions:  Message left to call us if necessary.

## 2017-11-11 ENCOUNTER — Encounter: Payer: Self-pay | Admitting: Internal Medicine

## 2017-11-11 DIAGNOSIS — Z86010 Personal history of colonic polyps: Secondary | ICD-10-CM

## 2017-11-11 DIAGNOSIS — Z860101 Personal history of adenomatous and serrated colon polyps: Secondary | ICD-10-CM | POA: Insufficient documentation

## 2017-11-11 HISTORY — DX: Personal history of adenomatous and serrated colon polyps: Z86.0101

## 2017-11-11 HISTORY — DX: Personal history of colonic polyps: Z86.010

## 2017-11-11 NOTE — Progress Notes (Signed)
1 adenoma 1 hyperplastic polyp My Chart

## 2017-11-27 DIAGNOSIS — H35351 Cystoid macular degeneration, right eye: Secondary | ICD-10-CM | POA: Diagnosis not present

## 2017-11-27 DIAGNOSIS — H3589 Other specified retinal disorders: Secondary | ICD-10-CM | POA: Diagnosis not present

## 2017-11-27 DIAGNOSIS — H3561 Retinal hemorrhage, right eye: Secondary | ICD-10-CM | POA: Diagnosis not present

## 2018-01-15 DIAGNOSIS — H3589 Other specified retinal disorders: Secondary | ICD-10-CM | POA: Diagnosis not present

## 2018-01-15 DIAGNOSIS — T66XXXD Radiation sickness, unspecified, subsequent encounter: Secondary | ICD-10-CM | POA: Diagnosis not present

## 2018-01-15 DIAGNOSIS — H35351 Cystoid macular degeneration, right eye: Secondary | ICD-10-CM | POA: Diagnosis not present

## 2018-03-05 DIAGNOSIS — H35351 Cystoid macular degeneration, right eye: Secondary | ICD-10-CM | POA: Diagnosis not present

## 2018-03-05 DIAGNOSIS — T66XXXD Radiation sickness, unspecified, subsequent encounter: Secondary | ICD-10-CM | POA: Diagnosis not present

## 2018-03-05 DIAGNOSIS — H3589 Other specified retinal disorders: Secondary | ICD-10-CM | POA: Diagnosis not present

## 2018-05-07 DIAGNOSIS — T66XXXD Radiation sickness, unspecified, subsequent encounter: Secondary | ICD-10-CM | POA: Diagnosis not present

## 2018-05-07 DIAGNOSIS — H3589 Other specified retinal disorders: Secondary | ICD-10-CM | POA: Diagnosis not present

## 2018-05-07 DIAGNOSIS — H35351 Cystoid macular degeneration, right eye: Secondary | ICD-10-CM | POA: Diagnosis not present

## 2018-07-02 DIAGNOSIS — H35351 Cystoid macular degeneration, right eye: Secondary | ICD-10-CM | POA: Diagnosis not present

## 2018-07-02 DIAGNOSIS — T66XXXD Radiation sickness, unspecified, subsequent encounter: Secondary | ICD-10-CM | POA: Diagnosis not present

## 2018-08-13 DIAGNOSIS — H35351 Cystoid macular degeneration, right eye: Secondary | ICD-10-CM | POA: Diagnosis not present

## 2018-08-13 DIAGNOSIS — T66XXXD Radiation sickness, unspecified, subsequent encounter: Secondary | ICD-10-CM | POA: Diagnosis not present

## 2018-09-22 DIAGNOSIS — Z0000 Encounter for general adult medical examination without abnormal findings: Secondary | ICD-10-CM | POA: Diagnosis not present

## 2018-09-22 DIAGNOSIS — Z125 Encounter for screening for malignant neoplasm of prostate: Secondary | ICD-10-CM | POA: Diagnosis not present

## 2018-09-22 DIAGNOSIS — E7849 Other hyperlipidemia: Secondary | ICD-10-CM | POA: Diagnosis not present

## 2018-09-22 DIAGNOSIS — R7301 Impaired fasting glucose: Secondary | ICD-10-CM | POA: Diagnosis not present

## 2018-09-23 DIAGNOSIS — Z0000 Encounter for general adult medical examination without abnormal findings: Secondary | ICD-10-CM | POA: Diagnosis not present

## 2018-09-23 DIAGNOSIS — C6290 Malignant neoplasm of unspecified testis, unspecified whether descended or undescended: Secondary | ICD-10-CM | POA: Diagnosis not present

## 2018-09-23 DIAGNOSIS — E291 Testicular hypofunction: Secondary | ICD-10-CM | POA: Diagnosis not present

## 2018-09-23 DIAGNOSIS — R7301 Impaired fasting glucose: Secondary | ICD-10-CM | POA: Diagnosis not present

## 2018-09-23 DIAGNOSIS — K4090 Unilateral inguinal hernia, without obstruction or gangrene, not specified as recurrent: Secondary | ICD-10-CM | POA: Diagnosis not present

## 2018-09-23 DIAGNOSIS — Z1331 Encounter for screening for depression: Secondary | ICD-10-CM | POA: Diagnosis not present

## 2018-09-23 DIAGNOSIS — R3121 Asymptomatic microscopic hematuria: Secondary | ICD-10-CM | POA: Diagnosis not present

## 2018-09-29 IMAGING — CR DG CHEST 2V
2 series · 2 of 2 positions shown · non-contrast
Comparison: None.

CLINICAL DATA: Testicular carcinoma

EXAM:
CHEST  2 VIEW

[w chest pa]
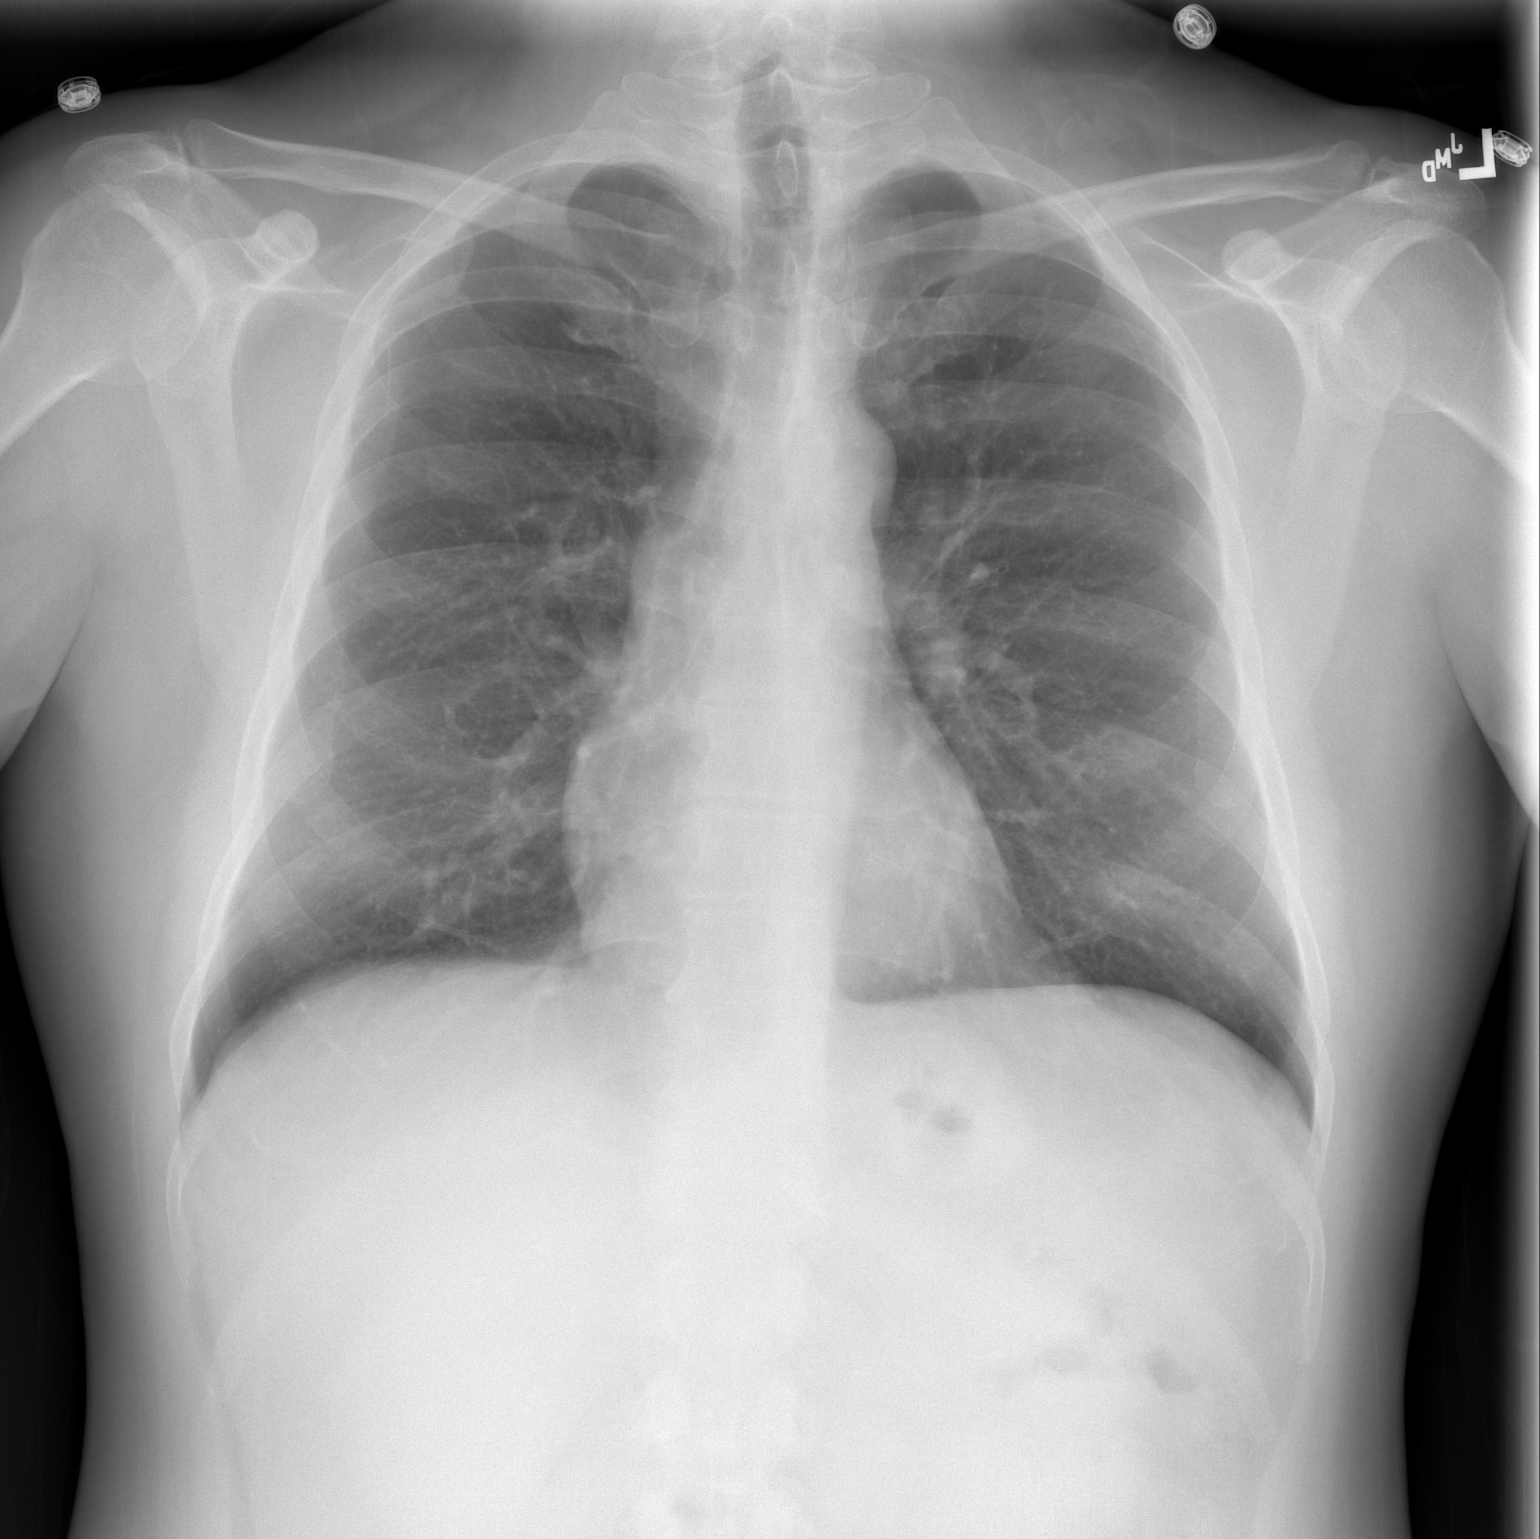

[w chest lat]
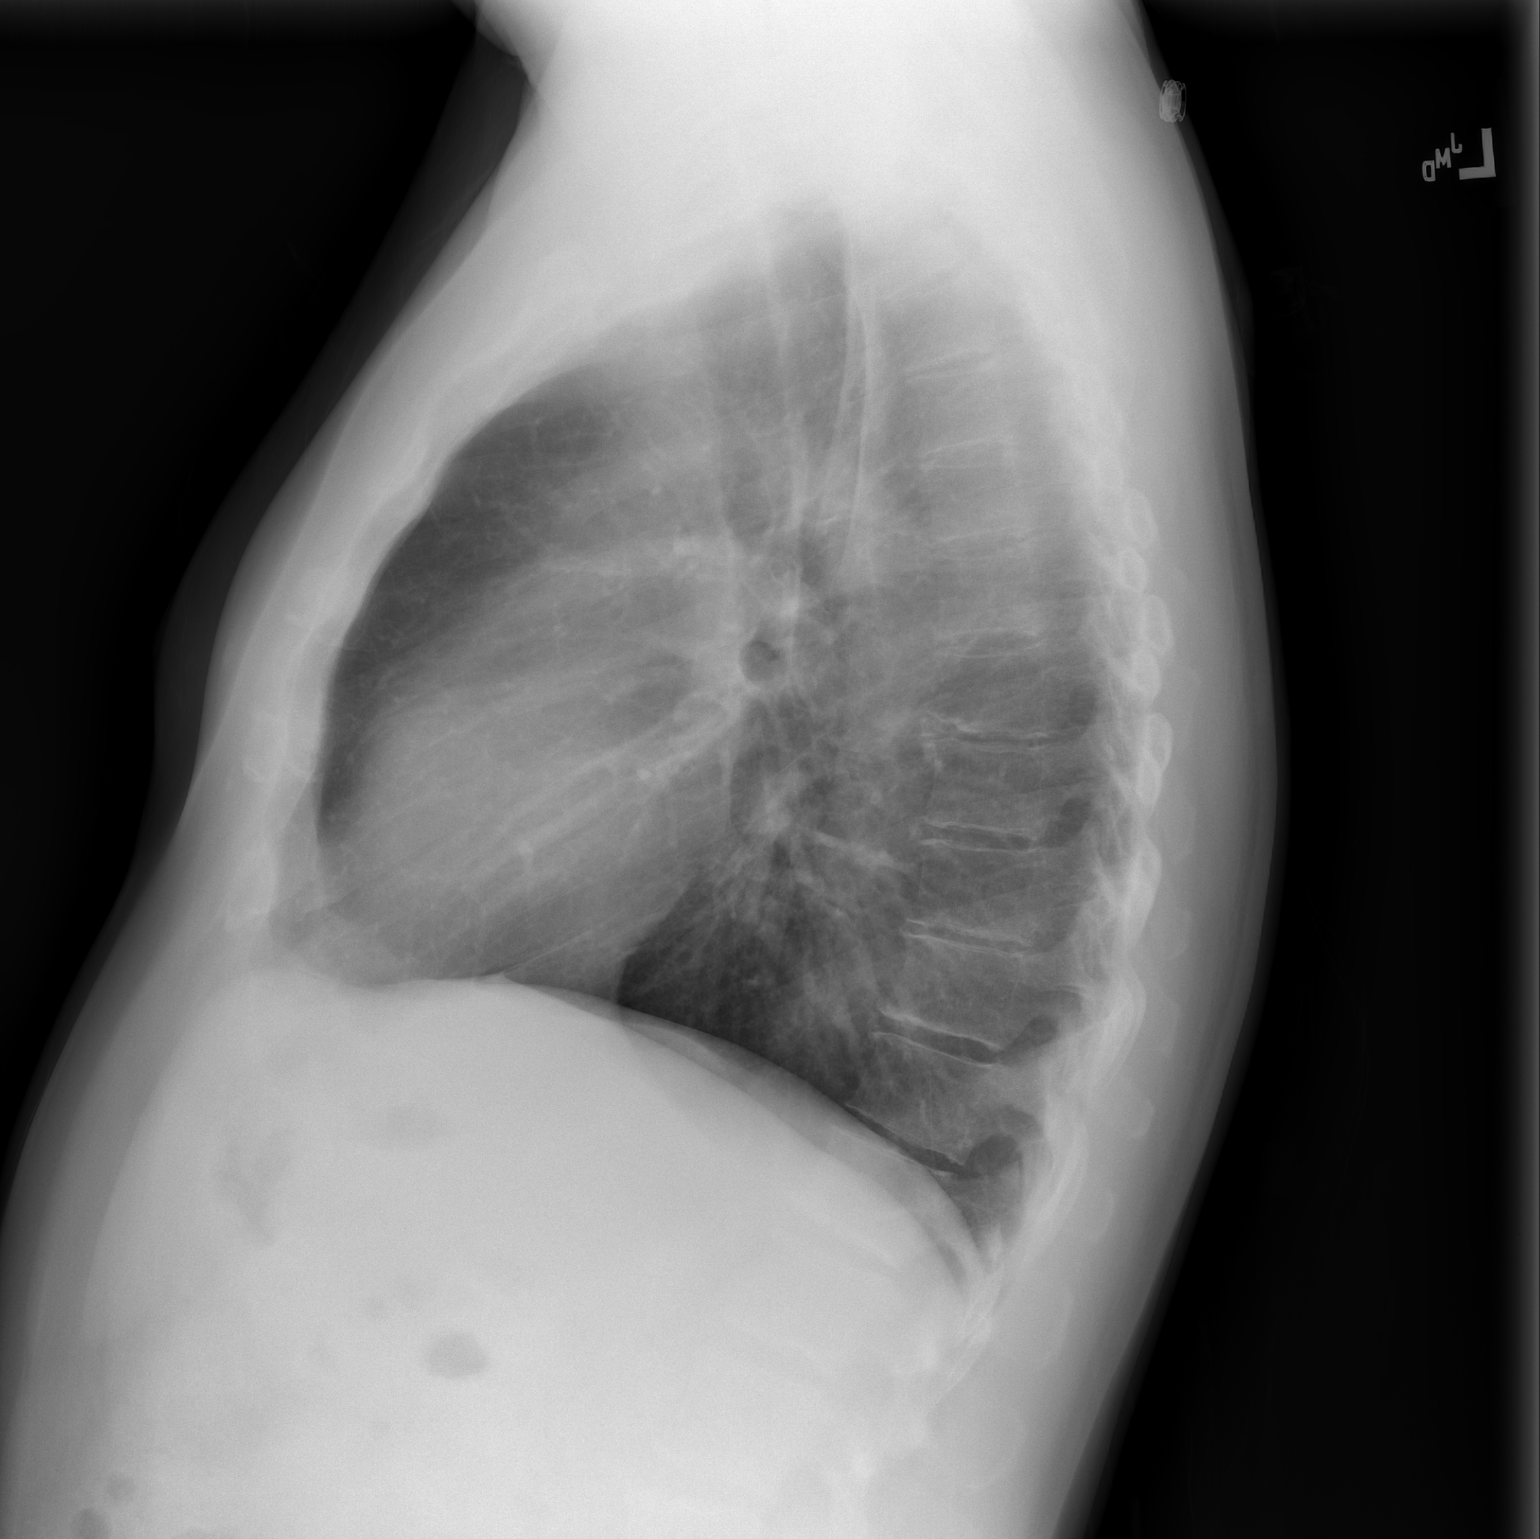

[2 of 2 positions shown; findings below may reference images not displayed]

FINDINGS: The lungs are clear. The heart size and pulmonary vascularity are
normal. No adenopathy. No bone lesions.
IMPRESSION: No abnormality noted.

## 2018-10-01 DIAGNOSIS — C6931 Malignant neoplasm of right choroid: Secondary | ICD-10-CM | POA: Diagnosis not present

## 2018-10-01 DIAGNOSIS — H3589 Other specified retinal disorders: Secondary | ICD-10-CM | POA: Diagnosis not present

## 2018-10-01 DIAGNOSIS — H35351 Cystoid macular degeneration, right eye: Secondary | ICD-10-CM | POA: Diagnosis not present

## 2018-10-01 DIAGNOSIS — T66XXXD Radiation sickness, unspecified, subsequent encounter: Secondary | ICD-10-CM | POA: Diagnosis not present

## 2018-10-20 DIAGNOSIS — D225 Melanocytic nevi of trunk: Secondary | ICD-10-CM | POA: Diagnosis not present

## 2018-10-20 DIAGNOSIS — D2261 Melanocytic nevi of right upper limb, including shoulder: Secondary | ICD-10-CM | POA: Diagnosis not present

## 2018-10-20 DIAGNOSIS — D2271 Melanocytic nevi of right lower limb, including hip: Secondary | ICD-10-CM | POA: Diagnosis not present

## 2018-10-20 DIAGNOSIS — D2262 Melanocytic nevi of left upper limb, including shoulder: Secondary | ICD-10-CM | POA: Diagnosis not present

## 2018-11-19 DIAGNOSIS — H3589 Other specified retinal disorders: Secondary | ICD-10-CM | POA: Diagnosis not present

## 2018-11-19 DIAGNOSIS — T66XXXD Radiation sickness, unspecified, subsequent encounter: Secondary | ICD-10-CM | POA: Diagnosis not present

## 2018-11-19 DIAGNOSIS — C6931 Malignant neoplasm of right choroid: Secondary | ICD-10-CM | POA: Diagnosis not present

## 2018-11-19 DIAGNOSIS — H35351 Cystoid macular degeneration, right eye: Secondary | ICD-10-CM | POA: Diagnosis not present

## 2019-01-07 DIAGNOSIS — H3589 Other specified retinal disorders: Secondary | ICD-10-CM | POA: Diagnosis not present

## 2019-01-07 DIAGNOSIS — T66XXXD Radiation sickness, unspecified, subsequent encounter: Secondary | ICD-10-CM | POA: Diagnosis not present

## 2019-01-07 DIAGNOSIS — H35351 Cystoid macular degeneration, right eye: Secondary | ICD-10-CM | POA: Diagnosis not present

## 2019-01-28 DIAGNOSIS — Z23 Encounter for immunization: Secondary | ICD-10-CM | POA: Diagnosis not present

## 2019-03-04 DIAGNOSIS — C6931 Malignant neoplasm of right choroid: Secondary | ICD-10-CM | POA: Diagnosis not present

## 2019-03-04 DIAGNOSIS — T66XXXD Radiation sickness, unspecified, subsequent encounter: Secondary | ICD-10-CM | POA: Diagnosis not present

## 2019-03-04 DIAGNOSIS — H3589 Other specified retinal disorders: Secondary | ICD-10-CM | POA: Diagnosis not present

## 2019-03-04 DIAGNOSIS — H35351 Cystoid macular degeneration, right eye: Secondary | ICD-10-CM | POA: Diagnosis not present

## 2019-04-22 DIAGNOSIS — L821 Other seborrheic keratosis: Secondary | ICD-10-CM | POA: Diagnosis not present

## 2019-04-22 DIAGNOSIS — B078 Other viral warts: Secondary | ICD-10-CM | POA: Diagnosis not present

## 2019-04-22 DIAGNOSIS — D485 Neoplasm of uncertain behavior of skin: Secondary | ICD-10-CM | POA: Diagnosis not present

## 2019-04-22 DIAGNOSIS — D2221 Melanocytic nevi of right ear and external auricular canal: Secondary | ICD-10-CM | POA: Diagnosis not present

## 2019-04-22 DIAGNOSIS — D225 Melanocytic nevi of trunk: Secondary | ICD-10-CM | POA: Diagnosis not present

## 2019-04-22 DIAGNOSIS — L570 Actinic keratosis: Secondary | ICD-10-CM | POA: Diagnosis not present

## 2019-04-30 DIAGNOSIS — L98499 Non-pressure chronic ulcer of skin of other sites with unspecified severity: Secondary | ICD-10-CM | POA: Diagnosis not present

## 2019-04-30 DIAGNOSIS — D485 Neoplasm of uncertain behavior of skin: Secondary | ICD-10-CM | POA: Diagnosis not present

## 2019-05-06 DIAGNOSIS — C6931 Malignant neoplasm of right choroid: Secondary | ICD-10-CM | POA: Diagnosis not present

## 2019-05-06 DIAGNOSIS — H35351 Cystoid macular degeneration, right eye: Secondary | ICD-10-CM | POA: Diagnosis not present

## 2019-05-06 DIAGNOSIS — T66XXXD Radiation sickness, unspecified, subsequent encounter: Secondary | ICD-10-CM | POA: Diagnosis not present

## 2019-05-06 DIAGNOSIS — H3589 Other specified retinal disorders: Secondary | ICD-10-CM | POA: Diagnosis not present

## 2019-05-19 IMAGING — CT CT ABD-PELV W/ CM
2 of 5 series · 16 of 46 positions shown, 18 images · IV contrast (agent unspecified)
Comparison: 07/26/2016

CLINICAL DATA: Followup right testicular seminoma. Previous
orchiectomy and radiation therapy.

EXAM:
CT ABDOMEN AND PELVIS WITH CONTRAST
TECHNIQUE: Multidetector CT imaging of the abdomen and pelvis was performed
using the standard protocol following bolus administration of
intravenous contrast.
CONTRAST:  100 mL Jsovue-NDD

[Series 2: axial st · axial · 0.75mm/px · z∈[-486,-91]mm · 13 of 93 slices shown, 15 images]
[im 7/93  soft-tissue]
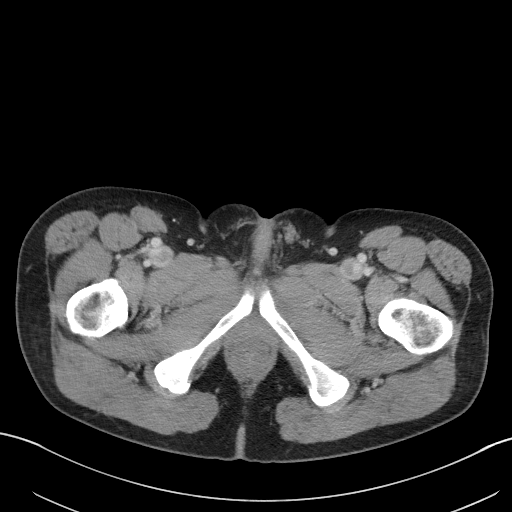
[im 7/93  bone]
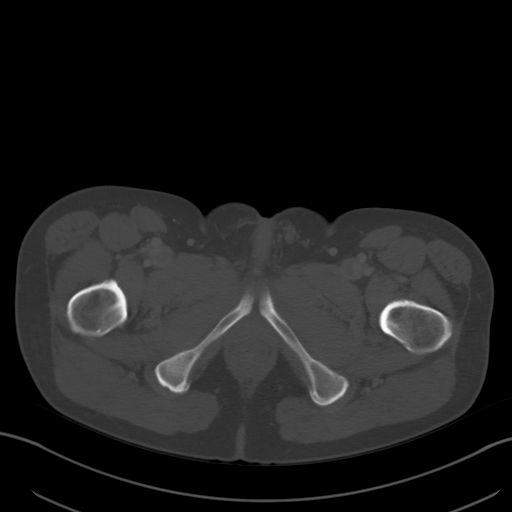
[im 14/93  soft-tissue]
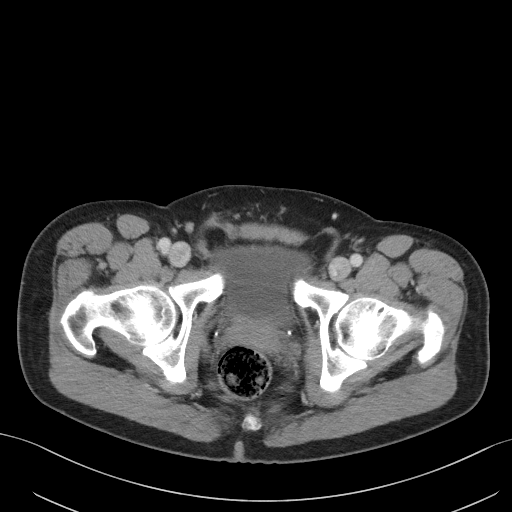
[im 20/93  soft-tissue]
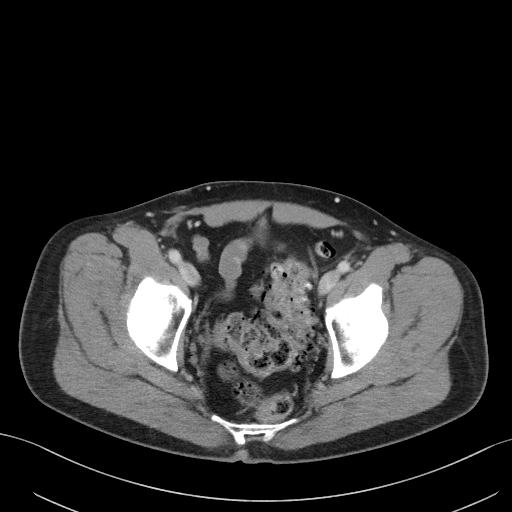
[im 27/93  soft-tissue]
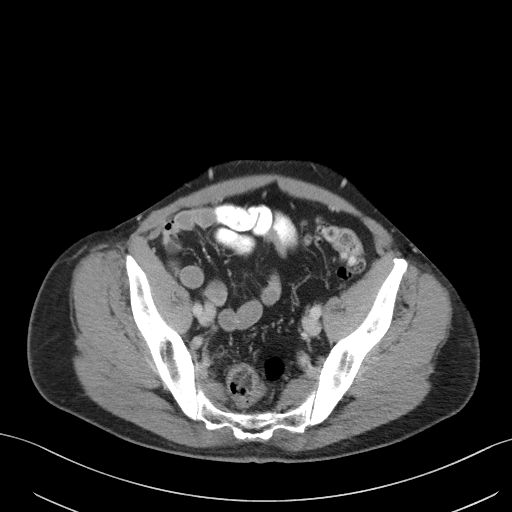
[im 33/93  soft-tissue]
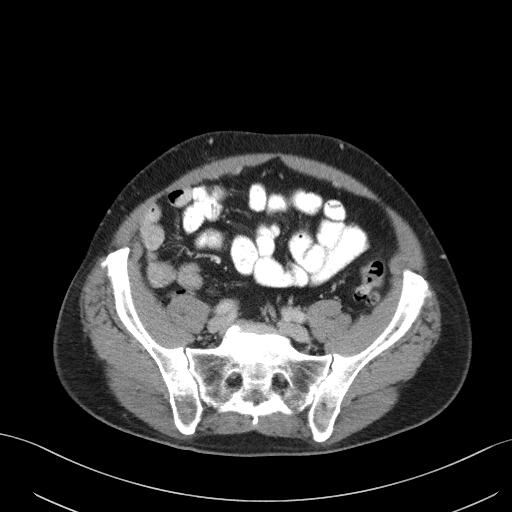
[im 40/93  soft-tissue]
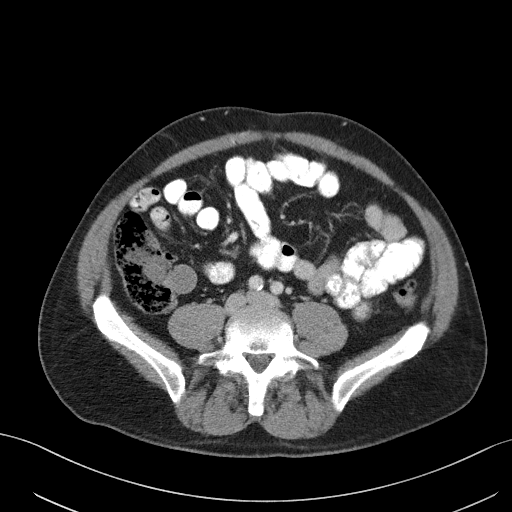
[im 47/93  soft-tissue]
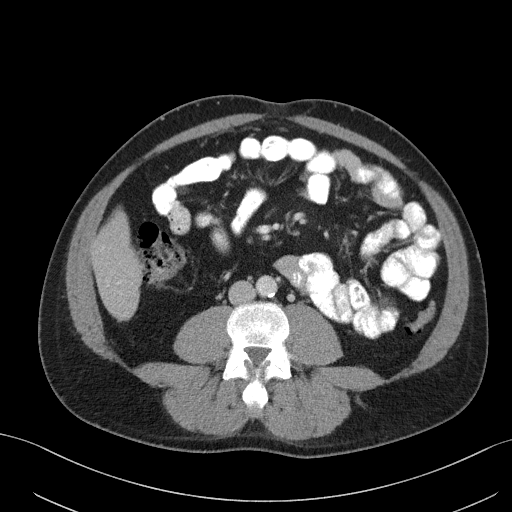
[im 53/93  soft-tissue]
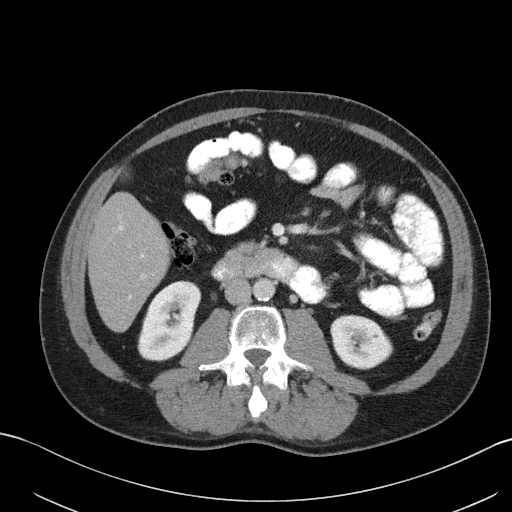
[im 60/93  soft-tissue]
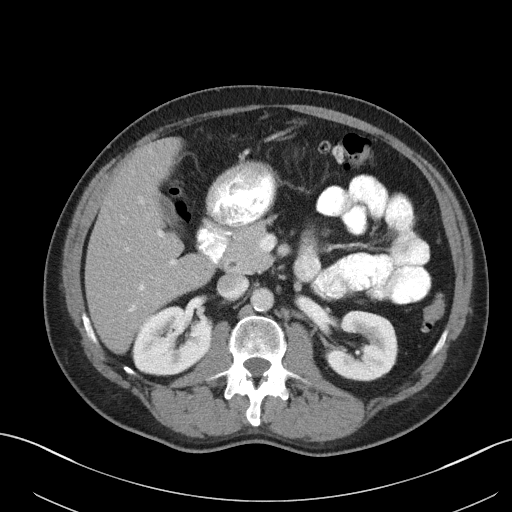
[im 60/93  bone]
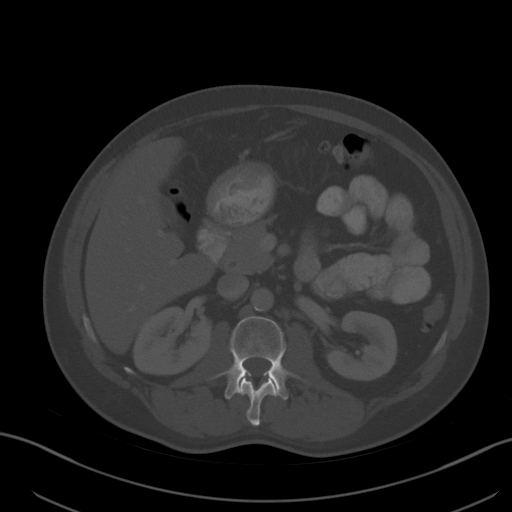
[im 66/93  soft-tissue]
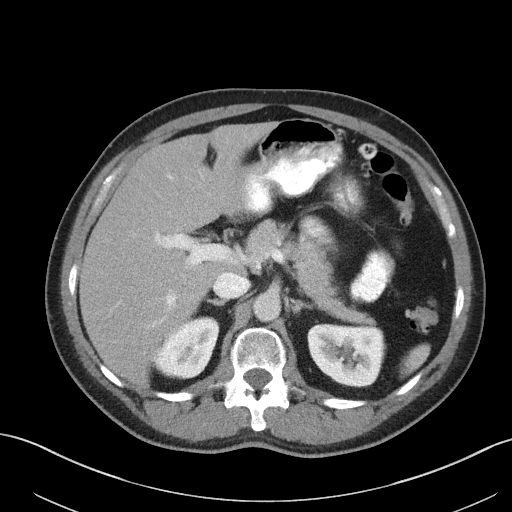
[im 73/93  soft-tissue]
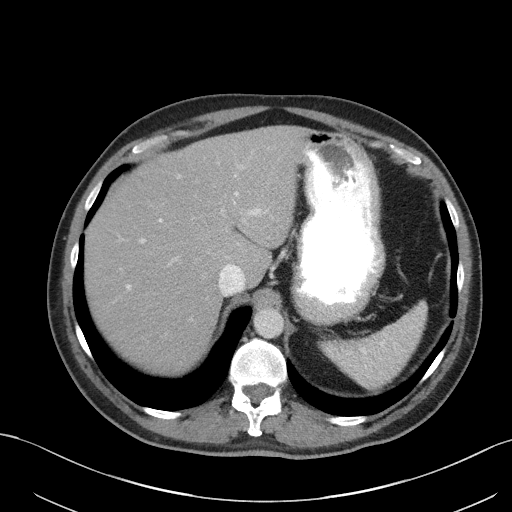
[im 79/93  soft-tissue]
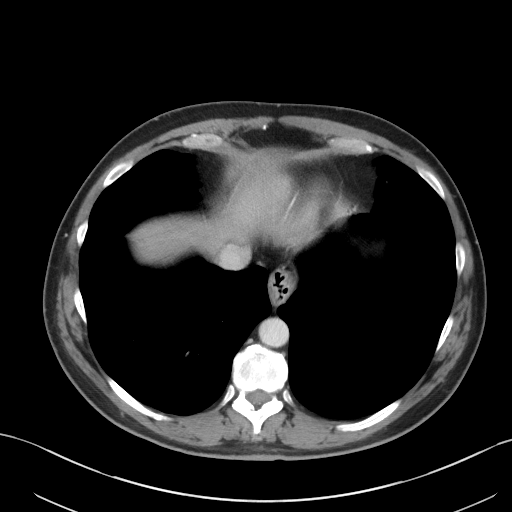
[im 86/93  soft-tissue]
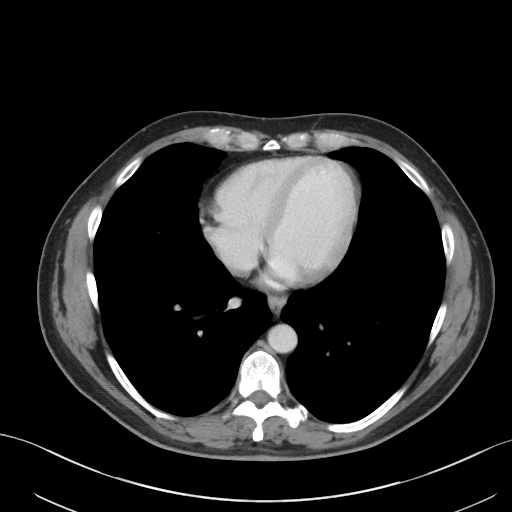

[Series 5: coronal st · coronal · 0.76mm/px · 3 of 101 slices shown]
[im 34/101  soft-tissue]
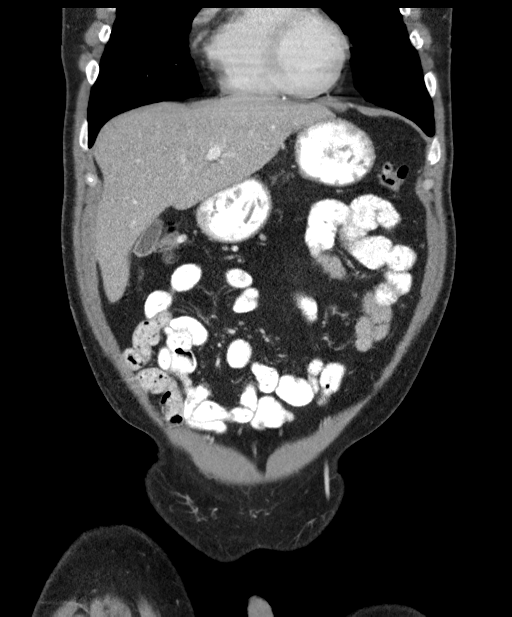
[im 45/101  soft-tissue]
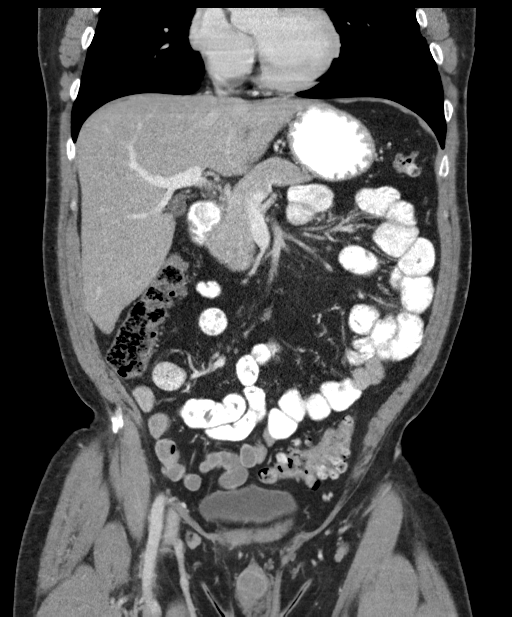
[im 56/101  soft-tissue]
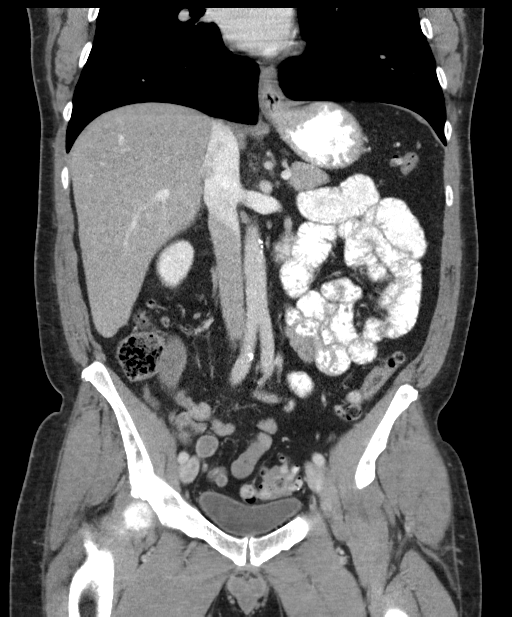

[16 of 46 positions shown; findings below may reference images not displayed]

FINDINGS: Lower Chest: No acute findings.

Hepatobiliary: No hepatic masses identified. Gallbladder is
unremarkable.

Pancreas:  No mass or inflammatory changes.

Spleen: Within normal limits in size and appearance.

Adrenals/Urinary Tract: No masses identified. No evidence of
hydronephrosis.

Stomach/Bowel: No evidence of obstruction, inflammatory process or
abnormal fluid collections. Normal appendix visualized. Diffuse
colonic diverticulosis again seen, without evidence of
diverticulitis.

Vascular/Lymphatic: No pathologically enlarged lymph nodes in the
abdomen or pelvis. No abdominal aortic aneurysm. Aortic
atherosclerosis.

Reproductive: Prior right orchiectomy. No mass or other significant
abnormality.

Other:  None.

Musculoskeletal:  No suspicious bone lesions identified.
IMPRESSION: Prior right orchiectomy. No evidence abdominal or pelvic metastatic
disease.

Colonic diverticulosis, without radiographic evidence of
diverticulitis or other acute findings.

## 2019-05-29 ENCOUNTER — Ambulatory Visit: Payer: Self-pay | Attending: Internal Medicine

## 2019-05-29 DIAGNOSIS — Z23 Encounter for immunization: Secondary | ICD-10-CM | POA: Insufficient documentation

## 2019-05-29 NOTE — Progress Notes (Signed)
   Covid-19 Vaccination Clinic  Name:  Ray Roberts    MRN: [id number] DOB: 02/29/1966  05/29/2019  [name] was observed post Covid-19 immunization for 15 minutes without incidence. He was provided with Vaccine Information Sheet and instruction to access the V-Safe system.   [name] was instructed to call 911 with any severe reactions post vaccine: Marland Kitchen Difficulty breathing  . Swelling of your face and throat  . A fast heartbeat  . A bad rash all over your body  . Dizziness and weakness    Immunizations Administered    Name Date Dose VIS Date Route   Pfizer COVID-19 Vaccine 05/29/2019  9:09 AM 0.3 mL 03/12/2019 Intramuscular   Manufacturer: [place]   Lot: [id number]   [place]: [id number]

## 2019-06-03 DIAGNOSIS — L280 Lichen simplex chronicus: Secondary | ICD-10-CM | POA: Diagnosis not present

## 2019-06-03 DIAGNOSIS — L821 Other seborrheic keratosis: Secondary | ICD-10-CM | POA: Diagnosis not present

## 2019-06-03 DIAGNOSIS — L570 Actinic keratosis: Secondary | ICD-10-CM | POA: Diagnosis not present

## 2019-06-23 ENCOUNTER — Ambulatory Visit: Payer: Self-pay | Attending: Internal Medicine

## 2019-06-23 DIAGNOSIS — Z23 Encounter for immunization: Secondary | ICD-10-CM

## 2019-06-23 NOTE — Progress Notes (Signed)
   Covid-19 Vaccination Clinic  Name:  Ray Roberts    MRN: [id number] DOB: 10-Feb-1966  06/23/2019  [name] was observed post Covid-19 immunization for 15 minutes without incident. He was provided with Vaccine Information Sheet and instruction to access the V-Safe system.   [name] was instructed to call 911 with any severe reactions post vaccine: Marland Kitchen Difficulty breathing  . Swelling of face and throat  . A fast heartbeat  . A bad rash all over body  . Dizziness and weakness   Immunizations Administered    Name Date Dose VIS Date Route   Pfizer COVID-19 Vaccine 06/23/2019  8:42 AM 0.3 mL 03/12/2019 Intramuscular   Manufacturer: [place]   Lot: [id number]   [place]: [id number]

## 2019-07-01 DIAGNOSIS — T66XXXD Radiation sickness, unspecified, subsequent encounter: Secondary | ICD-10-CM | POA: Diagnosis not present

## 2019-07-01 DIAGNOSIS — H35351 Cystoid macular degeneration, right eye: Secondary | ICD-10-CM | POA: Diagnosis not present

## 2019-07-01 DIAGNOSIS — C6931 Malignant neoplasm of right choroid: Secondary | ICD-10-CM | POA: Diagnosis not present

## 2019-07-01 DIAGNOSIS — H3589 Other specified retinal disorders: Secondary | ICD-10-CM | POA: Diagnosis not present

## 2019-08-12 DIAGNOSIS — H3589 Other specified retinal disorders: Secondary | ICD-10-CM | POA: Diagnosis not present

## 2019-08-12 DIAGNOSIS — C6931 Malignant neoplasm of right choroid: Secondary | ICD-10-CM | POA: Diagnosis not present

## 2019-08-12 DIAGNOSIS — T66XXXD Radiation sickness, unspecified, subsequent encounter: Secondary | ICD-10-CM | POA: Diagnosis not present

## 2019-08-12 DIAGNOSIS — H35351 Cystoid macular degeneration, right eye: Secondary | ICD-10-CM | POA: Diagnosis not present

## 2019-09-30 DIAGNOSIS — H3589 Other specified retinal disorders: Secondary | ICD-10-CM | POA: Diagnosis not present

## 2019-09-30 DIAGNOSIS — H35351 Cystoid macular degeneration, right eye: Secondary | ICD-10-CM | POA: Diagnosis not present

## 2019-09-30 DIAGNOSIS — T66XXXD Radiation sickness, unspecified, subsequent encounter: Secondary | ICD-10-CM | POA: Diagnosis not present

## 2019-10-19 DIAGNOSIS — B029 Zoster without complications: Secondary | ICD-10-CM | POA: Diagnosis not present

## 2019-11-03 DIAGNOSIS — E291 Testicular hypofunction: Secondary | ICD-10-CM | POA: Diagnosis not present

## 2019-11-03 DIAGNOSIS — Z125 Encounter for screening for malignant neoplasm of prostate: Secondary | ICD-10-CM | POA: Diagnosis not present

## 2019-11-03 DIAGNOSIS — E7849 Other hyperlipidemia: Secondary | ICD-10-CM | POA: Diagnosis not present

## 2019-11-03 DIAGNOSIS — R7301 Impaired fasting glucose: Secondary | ICD-10-CM | POA: Diagnosis not present

## 2019-11-03 DIAGNOSIS — Z0000 Encounter for general adult medical examination without abnormal findings: Secondary | ICD-10-CM | POA: Diagnosis not present

## 2019-11-10 DIAGNOSIS — Z0000 Encounter for general adult medical examination without abnormal findings: Secondary | ICD-10-CM | POA: Diagnosis not present

## 2019-11-10 DIAGNOSIS — Z1331 Encounter for screening for depression: Secondary | ICD-10-CM | POA: Diagnosis not present

## 2019-11-10 DIAGNOSIS — C439 Malignant melanoma of skin, unspecified: Secondary | ICD-10-CM | POA: Diagnosis not present

## 2019-11-10 DIAGNOSIS — R82998 Other abnormal findings in urine: Secondary | ICD-10-CM | POA: Diagnosis not present

## 2019-11-11 DIAGNOSIS — C6931 Malignant neoplasm of right choroid: Secondary | ICD-10-CM | POA: Diagnosis not present

## 2019-11-11 DIAGNOSIS — T66XXXD Radiation sickness, unspecified, subsequent encounter: Secondary | ICD-10-CM | POA: Diagnosis not present

## 2019-11-11 DIAGNOSIS — H35351 Cystoid macular degeneration, right eye: Secondary | ICD-10-CM | POA: Diagnosis not present

## 2019-11-11 DIAGNOSIS — H3589 Other specified retinal disorders: Secondary | ICD-10-CM | POA: Diagnosis not present

## 2019-12-16 DIAGNOSIS — T66XXXD Radiation sickness, unspecified, subsequent encounter: Secondary | ICD-10-CM | POA: Diagnosis not present

## 2019-12-16 DIAGNOSIS — C6931 Malignant neoplasm of right choroid: Secondary | ICD-10-CM | POA: Diagnosis not present

## 2019-12-16 DIAGNOSIS — H35351 Cystoid macular degeneration, right eye: Secondary | ICD-10-CM | POA: Diagnosis not present

## 2019-12-16 DIAGNOSIS — H3589 Other specified retinal disorders: Secondary | ICD-10-CM | POA: Diagnosis not present

## 2020-01-20 DIAGNOSIS — H3589 Other specified retinal disorders: Secondary | ICD-10-CM | POA: Diagnosis not present

## 2020-01-20 DIAGNOSIS — C6931 Malignant neoplasm of right choroid: Secondary | ICD-10-CM | POA: Diagnosis not present

## 2020-01-20 DIAGNOSIS — T66XXXD Radiation sickness, unspecified, subsequent encounter: Secondary | ICD-10-CM | POA: Diagnosis not present

## 2020-01-20 DIAGNOSIS — H35351 Cystoid macular degeneration, right eye: Secondary | ICD-10-CM | POA: Diagnosis not present

## 2020-03-02 DIAGNOSIS — C6931 Malignant neoplasm of right choroid: Secondary | ICD-10-CM | POA: Diagnosis not present

## 2020-03-02 DIAGNOSIS — H35351 Cystoid macular degeneration, right eye: Secondary | ICD-10-CM | POA: Diagnosis not present

## 2020-03-02 DIAGNOSIS — H3500 Unspecified background retinopathy: Secondary | ICD-10-CM | POA: Diagnosis not present

## 2020-03-02 DIAGNOSIS — T66XXXD Radiation sickness, unspecified, subsequent encounter: Secondary | ICD-10-CM | POA: Diagnosis not present

## 2020-04-12 DIAGNOSIS — C6931 Malignant neoplasm of right choroid: Secondary | ICD-10-CM | POA: Diagnosis not present

## 2020-04-12 DIAGNOSIS — H25811 Combined forms of age-related cataract, right eye: Secondary | ICD-10-CM | POA: Diagnosis not present

## 2020-04-12 DIAGNOSIS — X58XXXD Exposure to other specified factors, subsequent encounter: Secondary | ICD-10-CM | POA: Diagnosis not present

## 2020-04-12 DIAGNOSIS — H3581 Retinal edema: Secondary | ICD-10-CM | POA: Diagnosis not present

## 2020-04-12 DIAGNOSIS — H3589 Other specified retinal disorders: Secondary | ICD-10-CM | POA: Diagnosis not present

## 2020-04-12 DIAGNOSIS — T66XXXD Radiation sickness, unspecified, subsequent encounter: Secondary | ICD-10-CM | POA: Diagnosis not present

## 2020-05-18 DIAGNOSIS — H35351 Cystoid macular degeneration, right eye: Secondary | ICD-10-CM | POA: Diagnosis not present

## 2020-05-18 DIAGNOSIS — T66XXXD Radiation sickness, unspecified, subsequent encounter: Secondary | ICD-10-CM | POA: Diagnosis not present

## 2020-05-18 DIAGNOSIS — H3581 Retinal edema: Secondary | ICD-10-CM | POA: Diagnosis not present

## 2020-05-18 DIAGNOSIS — C6931 Malignant neoplasm of right choroid: Secondary | ICD-10-CM | POA: Diagnosis not present

## 2020-06-03 DIAGNOSIS — Z03818 Encounter for observation for suspected exposure to other biological agents ruled out: Secondary | ICD-10-CM | POA: Diagnosis not present

## 2020-06-03 DIAGNOSIS — Z20822 Contact with and (suspected) exposure to covid-19: Secondary | ICD-10-CM | POA: Diagnosis not present

## 2020-06-14 DIAGNOSIS — H52203 Unspecified astigmatism, bilateral: Secondary | ICD-10-CM | POA: Diagnosis not present

## 2020-06-14 DIAGNOSIS — H524 Presbyopia: Secondary | ICD-10-CM | POA: Diagnosis not present

## 2020-06-14 DIAGNOSIS — H5203 Hypermetropia, bilateral: Secondary | ICD-10-CM | POA: Diagnosis not present

## 2020-06-16 ENCOUNTER — Ambulatory Visit (INDEPENDENT_AMBULATORY_CARE_PROVIDER_SITE_OTHER): Payer: BC Managed Care – PPO | Admitting: Otolaryngology

## 2020-06-16 ENCOUNTER — Other Ambulatory Visit: Payer: Self-pay

## 2020-06-16 ENCOUNTER — Encounter (INDEPENDENT_AMBULATORY_CARE_PROVIDER_SITE_OTHER): Payer: Self-pay | Admitting: Otolaryngology

## 2020-06-16 VITALS — Temp 97.5°F

## 2020-06-16 DIAGNOSIS — H6123 Impacted cerumen, bilateral: Secondary | ICD-10-CM | POA: Diagnosis not present

## 2020-06-16 NOTE — Progress Notes (Signed)
HPI: Powell Halbert is a 55 y.o. male who presents for evaluation of wax buildup in his ears left side worse than right.  His left ear canal is totally occluded.  He last had his ears cleaned about 3 years ago..  Past Medical History:  Diagnosis Date  . Allergy   . Diverticulitis   . Elevated cholesterol   . GERD (gastroesophageal reflux disease)   . History of hiatal hernia   . Hx of adenomatous polyp of colon 11/11/2017  . Hx of seasonal allergies   . 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 Testicular cancer [facility])    right   Past Surgical History:  Procedure Laterality Date  . HIGH DOSE RADIATION IMPLANT INSERTION  2016   Right eye chorioid melanoma   . INGUINAL HERNIA REPAIR Right 07/11/2016   Procedure: RIGHT INGUINAL HERNIA REPAIR;  Surgeon: Jackolyn Confer, MD;  Location: [facility];  Service: General;  Laterality: Right;  Tap Block  . INSERTION OF MESH Right 07/11/2016   Procedure: INSERTION OF MESH;  Surgeon: Jackolyn Confer, MD;  Location: [facility];  Service: General;  Laterality: Right;  Tap block  . ORCHIECTOMY Right 07/11/2016   Procedure: RIGHT RADICAL ORCHIECTOMY;  Surgeon: Franchot Gallo, MD;  Location: [facility];  Service: Urology;  Laterality: Right;  . WISDOM TOOTH EXTRACTION     Social History   Socioeconomic History  . Marital status: Married    Spouse name: Not on file  . Number of children: Not on file  . Years of education: Not on file  . Highest education level: Not on file  Occupational History    Employer: [employer]  Tobacco Use  . Smoking status: Never Smoker  . Smokeless tobacco: Never Used  Vaping Use  . Vaping Use: Never used  Substance and Sexual Activity  . Alcohol use: Yes    Alcohol/week: 12.0 standard drinks    Types: 12 Cans of beer per week    Comment:  15 cans beer/week  . Drug use: No  . Sexual activity: Yes  Other Topics Concern  . Not on file  Social History Narrative  . Not on file   Social  Determinants of Health   Financial Resource Strain: Not on file  Food Insecurity: Not on file  Transportation Needs: Not on file  Physical Activity: Not on file  Stress: Not on file  Social Connections: Not on file   Family History  Problem Relation Age of Onset  . Cancer Father        lung/smoker  . Cancer Paternal Grandfather        prostate  . Prostate cancer Paternal Grandfather   . Colon cancer Neg Hx   . Colon polyps Neg Hx   . Esophageal cancer Neg Hx   . Pancreatic cancer Neg Hx   . Rectal cancer Neg Hx   . Stomach cancer Neg Hx    Allergies  Allergen Reactions  . Sulfa Antibiotics Hives  . Sulfasalazine Hives   Prior to Admission medications   Medication Sig Start Date End Date Taking? Authorizing Provider  atorvastatin (LIPITOR) 20 MG tablet Take 20 mg by mouth every evening. 07/03/16   [provider]  cetirizine (ZYRTEC) 10 MG tablet Take 10 mg by mouth every evening.    [provider]  diphenhydramine-acetaminophen (TYLENOL PM) 25-500 MG TABS tablet Take 1-2 tablets by mouth at bedtime as needed (for pain/sleep).    [provider]  famotidine (PEPCID) 20 MG tablet Take 20 mg by mouth daily as needed for heartburn or indigestion.    [provider]  fluticasone (FLONASE) 50 MCG/ACT nasal spray Place 1-2 sprays into both nostrils daily as needed for allergies.    [provider]     Positive ROS: Otherwise negative  All other systems have been reviewed and were otherwise negative with the exception of those mentioned in the HPI and as above.  Physical Exam: Constitutional: Alert, well-appearing, no acute distress Ears: External ears without lesions or tenderness. Ear canals large amount of wax in both ear canals left side much worse than right.  Ear canals were cleaned with suction and curettes.  TMs were clear bilaterally.. Nasal: External nose without lesions. Clear nasal passages Oral: Oropharynx clear. Neck: No  palpable adenopathy or masses Respiratory: Breathing comfortably  Skin: No facial/neck lesions or rash noted.  Procedures  Assessment: Bilateral cerumen impactions  Plan: This was cleaned in the office with resolution of his hearing problem. He will follow-up as needed  Radene Journey, MD

## 2020-06-29 DIAGNOSIS — H3589 Other specified retinal disorders: Secondary | ICD-10-CM | POA: Diagnosis not present

## 2020-06-29 DIAGNOSIS — T66XXXD Radiation sickness, unspecified, subsequent encounter: Secondary | ICD-10-CM | POA: Diagnosis not present

## 2020-06-29 DIAGNOSIS — C6931 Malignant neoplasm of right choroid: Secondary | ICD-10-CM | POA: Diagnosis not present

## 2020-07-27 DIAGNOSIS — L723 Sebaceous cyst: Secondary | ICD-10-CM | POA: Diagnosis not present

## 2020-08-10 DIAGNOSIS — T66XXXD Radiation sickness, unspecified, subsequent encounter: Secondary | ICD-10-CM | POA: Diagnosis not present

## 2020-08-10 DIAGNOSIS — H3581 Retinal edema: Secondary | ICD-10-CM | POA: Diagnosis not present

## 2020-08-10 DIAGNOSIS — C6931 Malignant neoplasm of right choroid: Secondary | ICD-10-CM | POA: Diagnosis not present

## 2020-08-10 DIAGNOSIS — H268 Other specified cataract: Secondary | ICD-10-CM | POA: Diagnosis not present

## 2020-09-07 DIAGNOSIS — T66XXXA Radiation sickness, unspecified, initial encounter: Secondary | ICD-10-CM | POA: Diagnosis not present

## 2020-09-07 DIAGNOSIS — H3589 Other specified retinal disorders: Secondary | ICD-10-CM | POA: Diagnosis not present

## 2020-09-07 DIAGNOSIS — H3581 Retinal edema: Secondary | ICD-10-CM | POA: Diagnosis not present

## 2020-09-07 DIAGNOSIS — C6931 Malignant neoplasm of right choroid: Secondary | ICD-10-CM | POA: Diagnosis not present

## 2020-09-14 DIAGNOSIS — D2221 Melanocytic nevi of right ear and external auricular canal: Secondary | ICD-10-CM | POA: Diagnosis not present

## 2020-09-14 DIAGNOSIS — D485 Neoplasm of uncertain behavior of skin: Secondary | ICD-10-CM | POA: Diagnosis not present

## 2020-09-14 DIAGNOSIS — L814 Other melanin hyperpigmentation: Secondary | ICD-10-CM | POA: Diagnosis not present

## 2020-09-14 DIAGNOSIS — D2262 Melanocytic nevi of left upper limb, including shoulder: Secondary | ICD-10-CM | POA: Diagnosis not present

## 2020-09-14 DIAGNOSIS — L821 Other seborrheic keratosis: Secondary | ICD-10-CM | POA: Diagnosis not present

## 2020-09-14 DIAGNOSIS — D1801 Hemangioma of skin and subcutaneous tissue: Secondary | ICD-10-CM | POA: Diagnosis not present

## 2020-10-05 DIAGNOSIS — T66XXXA Radiation sickness, unspecified, initial encounter: Secondary | ICD-10-CM | POA: Diagnosis not present

## 2020-10-05 DIAGNOSIS — H3581 Retinal edema: Secondary | ICD-10-CM | POA: Diagnosis not present

## 2020-10-05 DIAGNOSIS — H3589 Other specified retinal disorders: Secondary | ICD-10-CM | POA: Diagnosis not present

## 2020-10-05 DIAGNOSIS — C6931 Malignant neoplasm of right choroid: Secondary | ICD-10-CM | POA: Diagnosis not present

## 2020-11-09 DIAGNOSIS — T66XXXA Radiation sickness, unspecified, initial encounter: Secondary | ICD-10-CM | POA: Diagnosis not present

## 2020-11-09 DIAGNOSIS — H268 Other specified cataract: Secondary | ICD-10-CM | POA: Diagnosis not present

## 2020-11-09 DIAGNOSIS — H3581 Retinal edema: Secondary | ICD-10-CM | POA: Diagnosis not present

## 2020-11-09 DIAGNOSIS — C6931 Malignant neoplasm of right choroid: Secondary | ICD-10-CM | POA: Diagnosis not present

## 2020-12-12 DIAGNOSIS — R7301 Impaired fasting glucose: Secondary | ICD-10-CM | POA: Diagnosis not present

## 2020-12-12 DIAGNOSIS — Z125 Encounter for screening for malignant neoplasm of prostate: Secondary | ICD-10-CM | POA: Diagnosis not present

## 2020-12-12 DIAGNOSIS — E785 Hyperlipidemia, unspecified: Secondary | ICD-10-CM | POA: Diagnosis not present

## 2020-12-14 DIAGNOSIS — H3581 Retinal edema: Secondary | ICD-10-CM | POA: Diagnosis not present

## 2020-12-14 DIAGNOSIS — C6931 Malignant neoplasm of right choroid: Secondary | ICD-10-CM | POA: Diagnosis not present

## 2020-12-14 DIAGNOSIS — H3589 Other specified retinal disorders: Secondary | ICD-10-CM | POA: Diagnosis not present

## 2020-12-14 DIAGNOSIS — T66XXXA Radiation sickness, unspecified, initial encounter: Secondary | ICD-10-CM | POA: Diagnosis not present

## 2020-12-19 DIAGNOSIS — Z0000 Encounter for general adult medical examination without abnormal findings: Secondary | ICD-10-CM | POA: Diagnosis not present

## 2020-12-19 DIAGNOSIS — Z1339 Encounter for screening examination for other mental health and behavioral disorders: Secondary | ICD-10-CM | POA: Diagnosis not present

## 2020-12-19 DIAGNOSIS — Z1331 Encounter for screening for depression: Secondary | ICD-10-CM | POA: Diagnosis not present

## 2020-12-19 DIAGNOSIS — R82998 Other abnormal findings in urine: Secondary | ICD-10-CM | POA: Diagnosis not present

## 2020-12-19 DIAGNOSIS — E291 Testicular hypofunction: Secondary | ICD-10-CM | POA: Diagnosis not present

## 2020-12-20 ENCOUNTER — Other Ambulatory Visit: Payer: Self-pay | Admitting: Internal Medicine

## 2020-12-20 DIAGNOSIS — E785 Hyperlipidemia, unspecified: Secondary | ICD-10-CM

## 2021-01-16 ENCOUNTER — Ambulatory Visit
Admission: RE | Admit: 2021-01-16 | Discharge: 2021-01-16 | Disposition: A | Discharge status: Home / Self Care | Payer: No Typology Code available for payment source | Source: Ambulatory Visit | Attending: Internal Medicine | Admitting: Internal Medicine

## 2021-01-16 DIAGNOSIS — E785 Hyperlipidemia, unspecified: Secondary | ICD-10-CM

## 2021-01-18 DIAGNOSIS — C6931 Malignant neoplasm of right choroid: Secondary | ICD-10-CM | POA: Diagnosis not present

## 2021-01-18 DIAGNOSIS — H3589 Other specified retinal disorders: Secondary | ICD-10-CM | POA: Diagnosis not present

## 2021-01-18 DIAGNOSIS — H3581 Retinal edema: Secondary | ICD-10-CM | POA: Diagnosis not present

## 2021-01-18 DIAGNOSIS — T66XXXA Radiation sickness, unspecified, initial encounter: Secondary | ICD-10-CM | POA: Diagnosis not present

## 2021-03-01 DIAGNOSIS — T66XXXD Radiation sickness, unspecified, subsequent encounter: Secondary | ICD-10-CM | POA: Diagnosis not present

## 2021-03-01 DIAGNOSIS — C6931 Malignant neoplasm of right choroid: Secondary | ICD-10-CM | POA: Diagnosis not present

## 2021-03-01 DIAGNOSIS — H3589 Other specified retinal disorders: Secondary | ICD-10-CM | POA: Diagnosis not present

## 2021-03-01 DIAGNOSIS — H35351 Cystoid macular degeneration, right eye: Secondary | ICD-10-CM | POA: Diagnosis not present

## 2021-04-12 DIAGNOSIS — T66XXXD Radiation sickness, unspecified, subsequent encounter: Secondary | ICD-10-CM | POA: Diagnosis not present

## 2021-04-12 DIAGNOSIS — H35351 Cystoid macular degeneration, right eye: Secondary | ICD-10-CM | POA: Diagnosis not present

## 2021-04-12 DIAGNOSIS — H3589 Other specified retinal disorders: Secondary | ICD-10-CM | POA: Diagnosis not present

## 2021-04-12 DIAGNOSIS — C6931 Malignant neoplasm of right choroid: Secondary | ICD-10-CM | POA: Diagnosis not present

## 2021-05-17 DIAGNOSIS — H3589 Other specified retinal disorders: Secondary | ICD-10-CM | POA: Diagnosis not present

## 2021-05-17 DIAGNOSIS — T66XXXD Radiation sickness, unspecified, subsequent encounter: Secondary | ICD-10-CM | POA: Diagnosis not present

## 2021-05-17 DIAGNOSIS — H3581 Retinal edema: Secondary | ICD-10-CM | POA: Diagnosis not present

## 2021-05-17 DIAGNOSIS — C6931 Malignant neoplasm of right choroid: Secondary | ICD-10-CM | POA: Diagnosis not present

## 2021-07-05 DIAGNOSIS — H3581 Retinal edema: Secondary | ICD-10-CM | POA: Diagnosis not present

## 2021-07-05 DIAGNOSIS — T66XXXD Radiation sickness, unspecified, subsequent encounter: Secondary | ICD-10-CM | POA: Diagnosis not present

## 2021-07-05 DIAGNOSIS — H25811 Combined forms of age-related cataract, right eye: Secondary | ICD-10-CM | POA: Diagnosis not present

## 2021-07-05 DIAGNOSIS — C6931 Malignant neoplasm of right choroid: Secondary | ICD-10-CM | POA: Diagnosis not present

## 2021-07-05 DIAGNOSIS — H3589 Other specified retinal disorders: Secondary | ICD-10-CM | POA: Diagnosis not present

## 2021-08-16 DIAGNOSIS — H25811 Combined forms of age-related cataract, right eye: Secondary | ICD-10-CM | POA: Diagnosis not present

## 2021-08-16 DIAGNOSIS — H3581 Retinal edema: Secondary | ICD-10-CM | POA: Diagnosis not present

## 2021-08-16 DIAGNOSIS — C6931 Malignant neoplasm of right choroid: Secondary | ICD-10-CM | POA: Diagnosis not present

## 2021-08-16 DIAGNOSIS — T66XXXD Radiation sickness, unspecified, subsequent encounter: Secondary | ICD-10-CM | POA: Diagnosis not present

## 2021-11-15 DIAGNOSIS — T66XXXD Radiation sickness, unspecified, subsequent encounter: Secondary | ICD-10-CM | POA: Diagnosis not present

## 2021-11-15 DIAGNOSIS — H3589 Other specified retinal disorders: Secondary | ICD-10-CM | POA: Diagnosis not present

## 2021-11-15 DIAGNOSIS — H3581 Retinal edema: Secondary | ICD-10-CM | POA: Diagnosis not present

## 2021-11-15 DIAGNOSIS — H25811 Combined forms of age-related cataract, right eye: Secondary | ICD-10-CM | POA: Diagnosis not present

## 2021-11-15 DIAGNOSIS — C6931 Malignant neoplasm of right choroid: Secondary | ICD-10-CM | POA: Diagnosis not present

## 2022-01-15 DIAGNOSIS — Z1212 Encounter for screening for malignant neoplasm of rectum: Secondary | ICD-10-CM | POA: Diagnosis not present

## 2022-01-15 DIAGNOSIS — Z125 Encounter for screening for malignant neoplasm of prostate: Secondary | ICD-10-CM | POA: Diagnosis not present

## 2022-01-15 DIAGNOSIS — E785 Hyperlipidemia, unspecified: Secondary | ICD-10-CM | POA: Diagnosis not present

## 2022-01-15 DIAGNOSIS — E291 Testicular hypofunction: Secondary | ICD-10-CM | POA: Diagnosis not present

## 2022-01-24 DIAGNOSIS — Z23 Encounter for immunization: Secondary | ICD-10-CM | POA: Diagnosis not present

## 2022-01-24 DIAGNOSIS — R7301 Impaired fasting glucose: Secondary | ICD-10-CM | POA: Diagnosis not present

## 2022-01-24 DIAGNOSIS — Z1331 Encounter for screening for depression: Secondary | ICD-10-CM | POA: Diagnosis not present

## 2022-01-24 DIAGNOSIS — Z0000 Encounter for general adult medical examination without abnormal findings: Secondary | ICD-10-CM | POA: Diagnosis not present

## 2022-01-24 DIAGNOSIS — R82998 Other abnormal findings in urine: Secondary | ICD-10-CM | POA: Diagnosis not present

## 2022-01-24 DIAGNOSIS — D126 Benign neoplasm of colon, unspecified: Secondary | ICD-10-CM | POA: Diagnosis not present

## 2022-03-07 DIAGNOSIS — T66XXXD Radiation sickness, unspecified, subsequent encounter: Secondary | ICD-10-CM | POA: Diagnosis not present

## 2022-03-07 DIAGNOSIS — H3589 Other specified retinal disorders: Secondary | ICD-10-CM | POA: Diagnosis not present

## 2022-03-07 DIAGNOSIS — H35351 Cystoid macular degeneration, right eye: Secondary | ICD-10-CM | POA: Diagnosis not present

## 2022-03-07 DIAGNOSIS — C6931 Malignant neoplasm of right choroid: Secondary | ICD-10-CM | POA: Diagnosis not present

## 2022-03-07 DIAGNOSIS — H25811 Combined forms of age-related cataract, right eye: Secondary | ICD-10-CM | POA: Diagnosis not present

## 2022-05-16 DIAGNOSIS — H9313 Tinnitus, bilateral: Secondary | ICD-10-CM | POA: Diagnosis not present

## 2022-05-16 DIAGNOSIS — H6123 Impacted cerumen, bilateral: Secondary | ICD-10-CM | POA: Diagnosis not present

## 2022-06-19 DIAGNOSIS — L820 Inflamed seborrheic keratosis: Secondary | ICD-10-CM | POA: Diagnosis not present

## 2022-06-19 DIAGNOSIS — D485 Neoplasm of uncertain behavior of skin: Secondary | ICD-10-CM | POA: Diagnosis not present

## 2022-07-09 DIAGNOSIS — T66XXXD Radiation sickness, unspecified, subsequent encounter: Secondary | ICD-10-CM | POA: Diagnosis not present

## 2022-07-09 DIAGNOSIS — T66XXXA Radiation sickness, unspecified, initial encounter: Secondary | ICD-10-CM | POA: Diagnosis not present

## 2022-07-09 DIAGNOSIS — H25811 Combined forms of age-related cataract, right eye: Secondary | ICD-10-CM | POA: Diagnosis not present

## 2022-07-09 DIAGNOSIS — H35351 Cystoid macular degeneration, right eye: Secondary | ICD-10-CM | POA: Diagnosis not present

## 2022-07-09 DIAGNOSIS — H3589 Other specified retinal disorders: Secondary | ICD-10-CM | POA: Diagnosis not present

## 2022-07-09 DIAGNOSIS — C6931 Malignant neoplasm of right choroid: Secondary | ICD-10-CM | POA: Diagnosis not present

## 2023-02-10 ENCOUNTER — Encounter: Payer: Self-pay | Admitting: Internal Medicine

## 2023-03-26 IMAGING — CT CT CARDIAC CORONARY ARTERY CALCIUM SCORE
3 series · 14 of 20 positions shown, 16 images · non-contrast
Comparison: None.

CLINICAL DATA: 55-year-old Caucasian male with history of
hyperlipidemia.

EXAM:
CT CARDIAC CORONARY ARTERY CALCIUM SCORE
TECHNIQUE: Non-contrast imaging through the heart was performed using
prospective ECG gating. Image post processing was performed on an
independent workstation, allowing for quantitative analysis of the
heart and coronary arteries. Note that this exam targets the heart
and the chest was not imaged in its entirety.

[Series 2: calcium scoring 2.00 qr36 bestdiast 72% hrt calciu · axial · 0.37mm/px · z∈[+1644,+1728]mm · 4 of 70 slices shown]
[im 14/70  vessel]
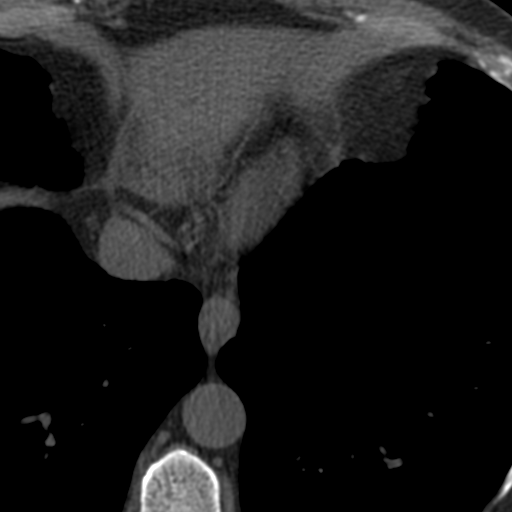
[im 28/70  vessel]
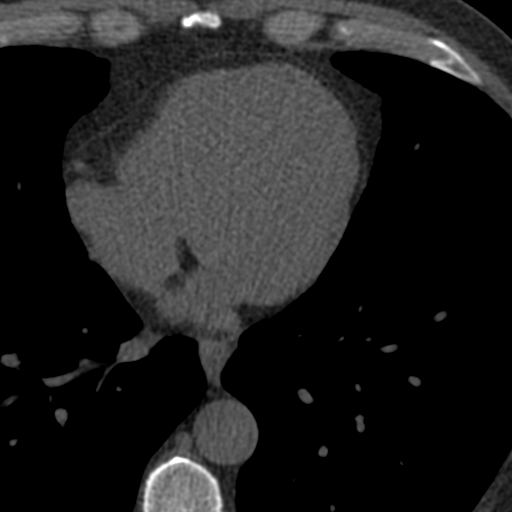
[im 42/70  vessel]
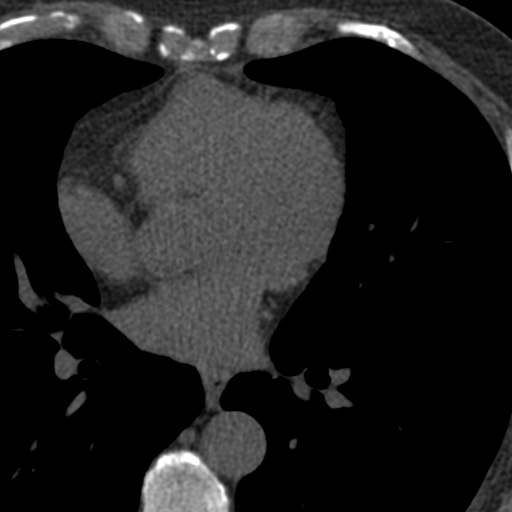
[im 56/70  vessel]
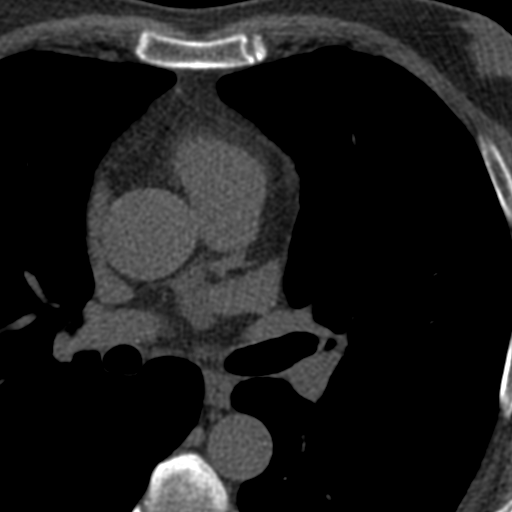

[Series 3: calcium scoring 2.00 br40 bestdiast 72% axial · axial · 0.60mm/px · z∈[+1640,+1732]mm · 5 of 70 slices shown, 7 images]
[im 12/70  vessel]
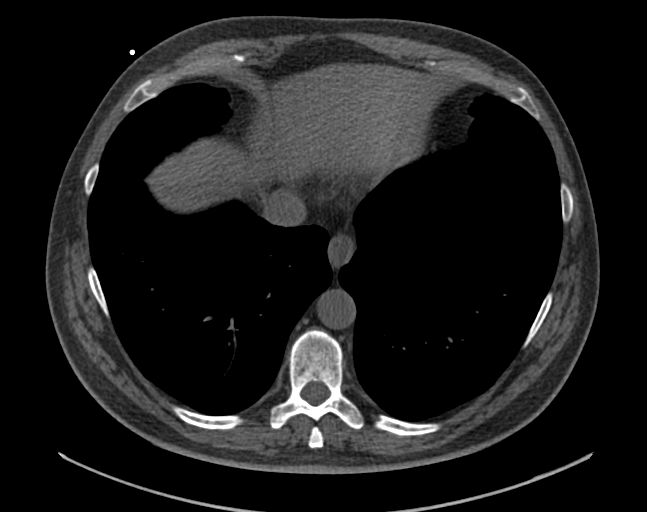
[im 12/70  lung]
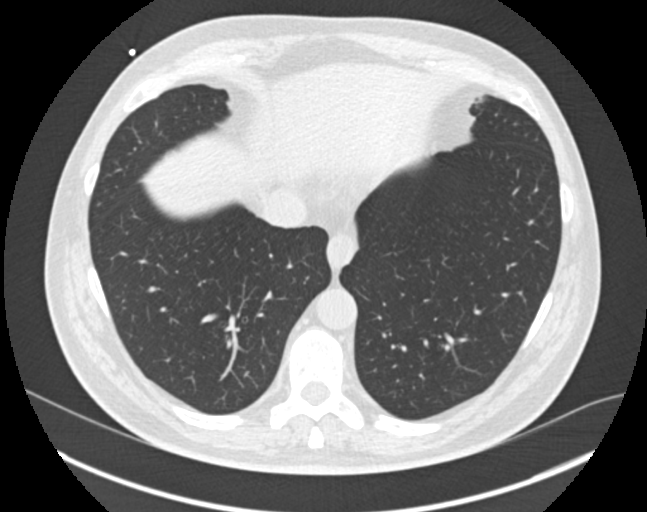
[im 24/70  vessel]
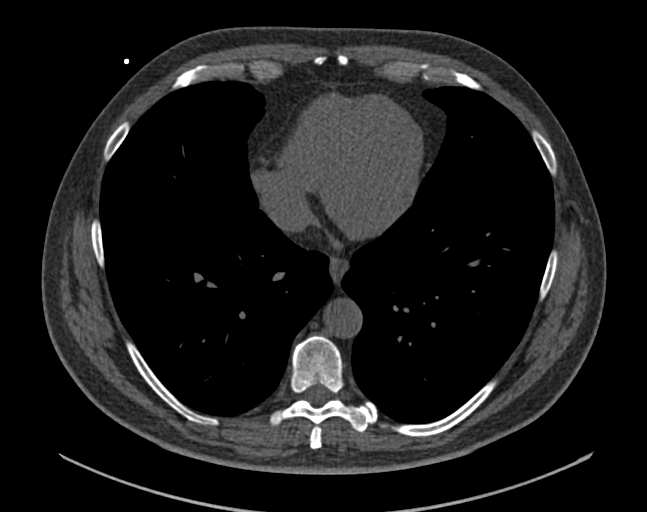
[im 35/70  vessel]
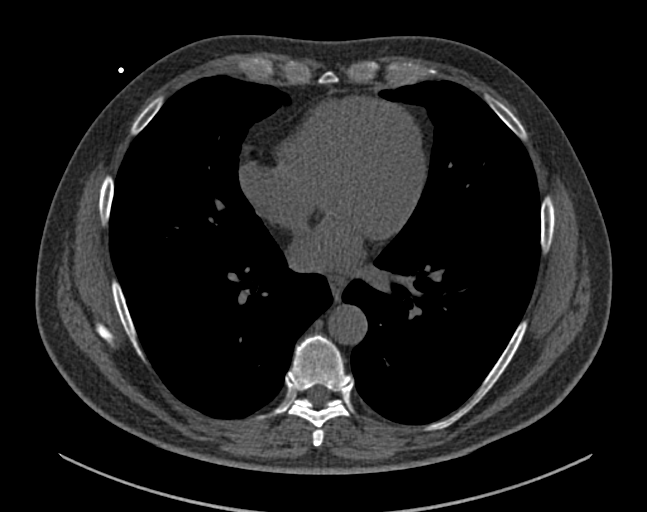
[im 47/70  vessel]
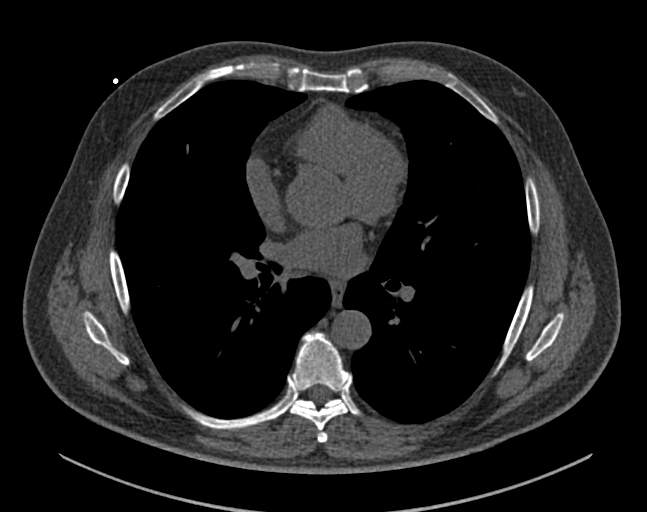
[im 58/70  vessel]
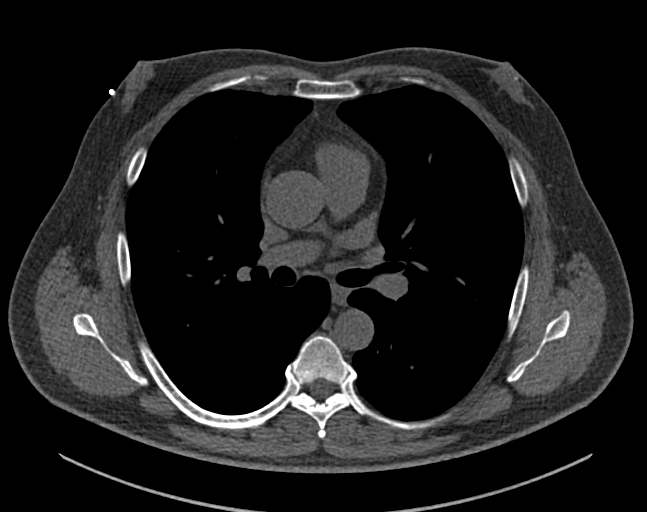
[im 58/70  lung]
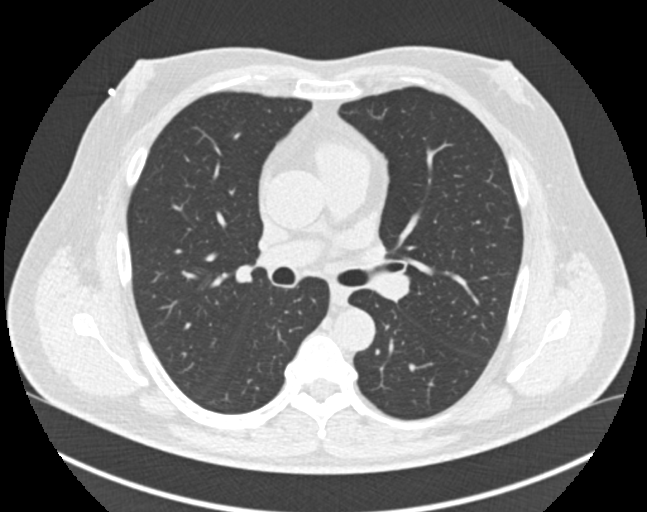

[Series 9: calcium scoring 2.00 br60 bestdiast 72% lungs · axial · 0.60mm/px · z∈[+1640,+1732]mm · 5 of 70 slices shown]
[im 12/70  vessel]
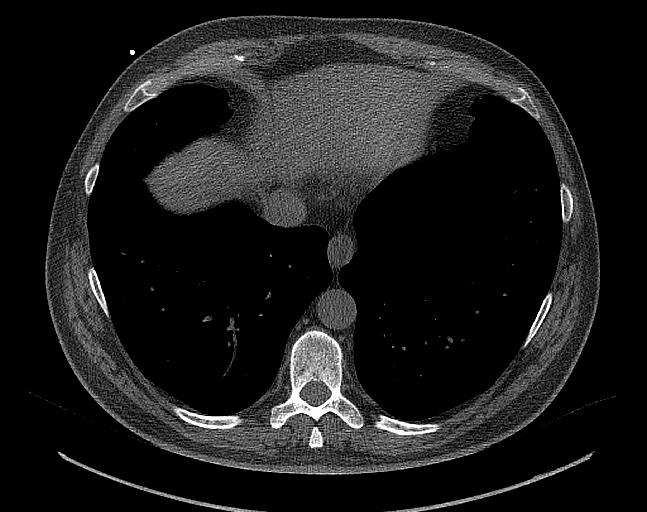
[im 24/70  vessel]
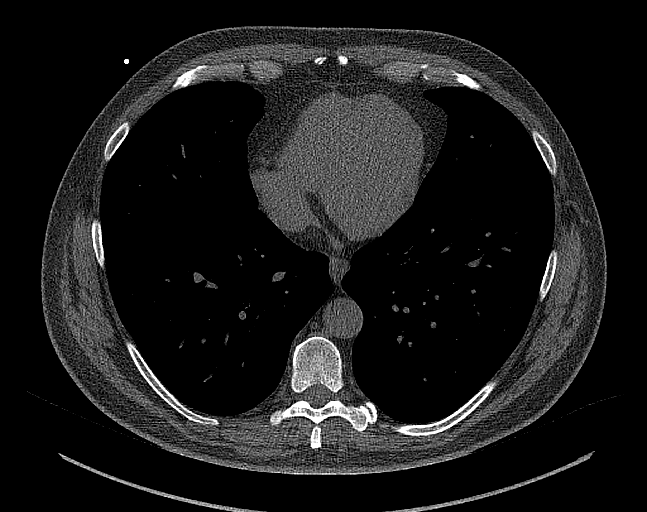
[im 35/70  vessel]
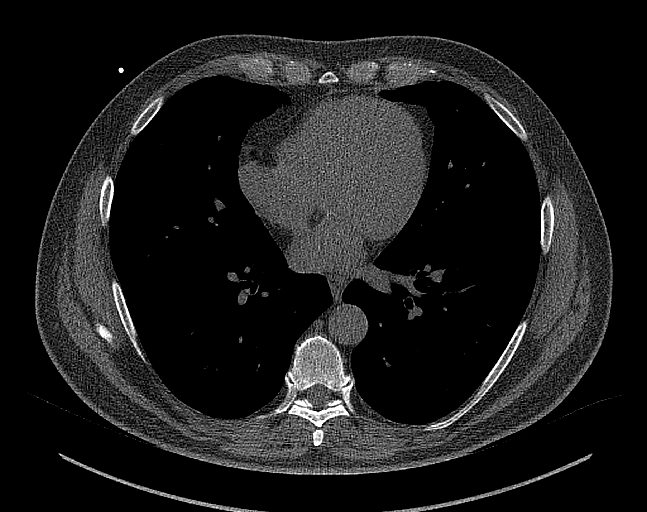
[im 47/70  vessel]
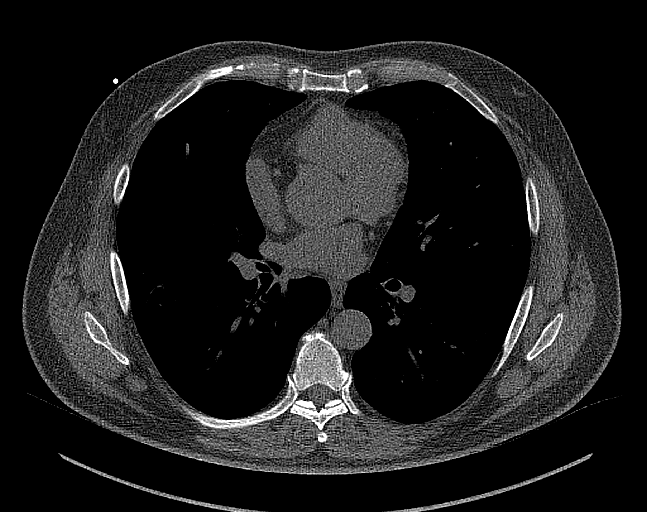
[im 58/70  vessel]
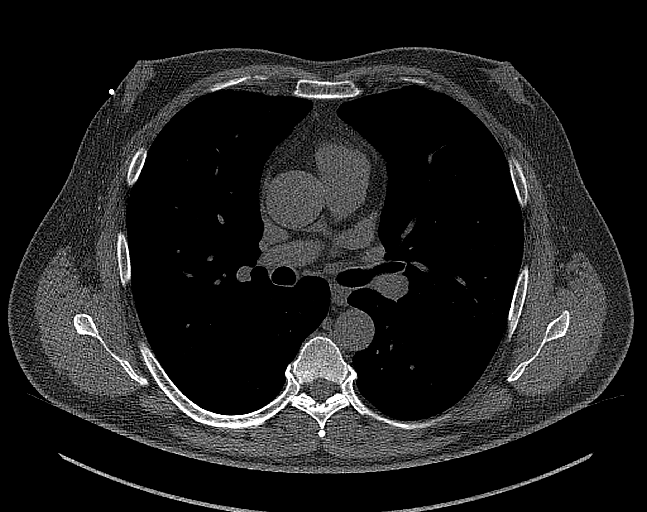

[14 of 20 positions shown; findings below may reference images not displayed]

FINDINGS: CORONARY CALCIUM SCORES:

Left Main: 0

LAD: 0

LCx: 0

RCA: 0

Total Agatston Score: 0

[HOSPITAL] percentile: 0

AORTA MEASUREMENTS:

Ascending Aorta: 34 mm

Descending Aorta: 24 mm

OTHER FINDINGS:


## 2023-04-21 DIAGNOSIS — E291 Testicular hypofunction: Secondary | ICD-10-CM | POA: Diagnosis not present

## 2023-04-21 DIAGNOSIS — Z1212 Encounter for screening for malignant neoplasm of rectum: Secondary | ICD-10-CM | POA: Diagnosis not present

## 2023-04-21 DIAGNOSIS — Z125 Encounter for screening for malignant neoplasm of prostate: Secondary | ICD-10-CM | POA: Diagnosis not present

## 2023-04-21 DIAGNOSIS — E785 Hyperlipidemia, unspecified: Secondary | ICD-10-CM | POA: Diagnosis not present

## 2023-04-21 DIAGNOSIS — R7301 Impaired fasting glucose: Secondary | ICD-10-CM | POA: Diagnosis not present

## 2023-04-28 DIAGNOSIS — Z0000 Encounter for general adult medical examination without abnormal findings: Secondary | ICD-10-CM | POA: Diagnosis not present

## 2023-04-28 DIAGNOSIS — E291 Testicular hypofunction: Secondary | ICD-10-CM | POA: Diagnosis not present

## 2023-04-28 DIAGNOSIS — R3589 Other polyuria: Secondary | ICD-10-CM | POA: Diagnosis not present

## 2023-04-28 DIAGNOSIS — R82998 Other abnormal findings in urine: Secondary | ICD-10-CM | POA: Diagnosis not present

## 2023-04-28 DIAGNOSIS — Z23 Encounter for immunization: Secondary | ICD-10-CM | POA: Diagnosis not present

## 2023-05-30 DIAGNOSIS — H9313 Tinnitus, bilateral: Secondary | ICD-10-CM | POA: Diagnosis not present

## 2023-05-30 DIAGNOSIS — H6123 Impacted cerumen, bilateral: Secondary | ICD-10-CM | POA: Diagnosis not present

## 2023-07-01 DIAGNOSIS — L570 Actinic keratosis: Secondary | ICD-10-CM | POA: Diagnosis not present

## 2023-07-01 DIAGNOSIS — L820 Inflamed seborrheic keratosis: Secondary | ICD-10-CM | POA: Diagnosis not present

## 2023-07-01 DIAGNOSIS — D225 Melanocytic nevi of trunk: Secondary | ICD-10-CM | POA: Diagnosis not present

## 2023-07-01 DIAGNOSIS — L814 Other melanin hyperpigmentation: Secondary | ICD-10-CM | POA: Diagnosis not present

## 2023-07-01 DIAGNOSIS — D2262 Melanocytic nevi of left upper limb, including shoulder: Secondary | ICD-10-CM | POA: Diagnosis not present

## 2023-07-01 DIAGNOSIS — D2221 Melanocytic nevi of right ear and external auricular canal: Secondary | ICD-10-CM | POA: Diagnosis not present

## 2023-12-12 DIAGNOSIS — K219 Gastro-esophageal reflux disease without esophagitis: Secondary | ICD-10-CM | POA: Diagnosis not present

## 2023-12-12 DIAGNOSIS — R7301 Impaired fasting glucose: Secondary | ICD-10-CM | POA: Diagnosis not present

## 2023-12-14 DIAGNOSIS — R21 Rash and other nonspecific skin eruption: Secondary | ICD-10-CM | POA: Diagnosis not present
